# Patient Record
Sex: Female | Born: 1970 | Race: Black or African American | Hispanic: No | Marital: Single | Smoking: Never smoker
Health system: Southern US, Community
[De-identification: ages and names within clinical notes are randomized; demographics above are authoritative.]

## PROBLEM LIST (undated history)

## (undated) DIAGNOSIS — Z789 Other specified health status: Secondary | ICD-10-CM

## (undated) HISTORY — PX: TUBAL LIGATION: SHX77

## (undated) HISTORY — PX: OTHER SURGICAL HISTORY: SHX169

## (undated) HISTORY — PX: DILATION AND CURETTAGE OF UTERUS: SHX78

---

## 1998-01-08 ENCOUNTER — Other Ambulatory Visit: Admission: RE | Admit: 1998-01-08 | Discharge: 1998-01-08 | Payer: Self-pay | Admitting: Obstetrics

## 1999-01-20 ENCOUNTER — Other Ambulatory Visit: Admission: RE | Admit: 1999-01-20 | Discharge: 1999-01-20 | Payer: Self-pay | Admitting: Obstetrics

## 1999-03-13 ENCOUNTER — Ambulatory Visit (HOSPITAL_BASED_OUTPATIENT_CLINIC_OR_DEPARTMENT_OTHER): Admission: RE | Admit: 1999-03-13 | Discharge: 1999-03-13 | Payer: Self-pay | Admitting: Ophthalmology

## 1999-07-23 ENCOUNTER — Other Ambulatory Visit: Admission: RE | Admit: 1999-07-23 | Discharge: 1999-07-23 | Payer: Self-pay | Admitting: Obstetrics

## 2000-05-06 ENCOUNTER — Emergency Department (HOSPITAL_COMMUNITY): Admission: EM | Admit: 2000-05-06 | Discharge: 2000-05-06 | Payer: Self-pay | Admitting: Emergency Medicine

## 2000-05-10 ENCOUNTER — Emergency Department (HOSPITAL_COMMUNITY): Admission: EM | Admit: 2000-05-10 | Discharge: 2000-05-10 | Payer: Self-pay | Admitting: Emergency Medicine

## 2000-05-10 ENCOUNTER — Encounter: Payer: Self-pay | Admitting: Emergency Medicine

## 2001-07-09 ENCOUNTER — Emergency Department (HOSPITAL_COMMUNITY): Admission: EM | Admit: 2001-07-09 | Discharge: 2001-07-09 | Payer: Self-pay | Admitting: Emergency Medicine

## 2001-07-14 ENCOUNTER — Emergency Department (HOSPITAL_COMMUNITY): Admission: EM | Admit: 2001-07-14 | Discharge: 2001-07-15 | Payer: Self-pay | Admitting: Emergency Medicine

## 2001-07-15 ENCOUNTER — Encounter: Payer: Self-pay | Admitting: Emergency Medicine

## 2001-07-25 ENCOUNTER — Other Ambulatory Visit: Admission: RE | Admit: 2001-07-25 | Discharge: 2001-07-25 | Payer: Self-pay | Admitting: Family Medicine

## 2002-10-25 ENCOUNTER — Emergency Department (HOSPITAL_COMMUNITY): Admission: EM | Admit: 2002-10-25 | Discharge: 2002-10-25 | Payer: Self-pay | Admitting: Emergency Medicine

## 2002-11-23 ENCOUNTER — Emergency Department (HOSPITAL_COMMUNITY): Admission: EM | Admit: 2002-11-23 | Discharge: 2002-11-23 | Payer: Self-pay | Admitting: Emergency Medicine

## 2003-01-09 ENCOUNTER — Other Ambulatory Visit: Admission: RE | Admit: 2003-01-09 | Discharge: 2003-01-09 | Payer: Self-pay | Admitting: Family Medicine

## 2003-01-14 ENCOUNTER — Ambulatory Visit (HOSPITAL_COMMUNITY): Admission: RE | Admit: 2003-01-14 | Discharge: 2003-01-14 | Payer: Self-pay | Admitting: Family Medicine

## 2003-08-19 ENCOUNTER — Ambulatory Visit (HOSPITAL_BASED_OUTPATIENT_CLINIC_OR_DEPARTMENT_OTHER): Admission: RE | Admit: 2003-08-19 | Discharge: 2003-08-19 | Payer: Self-pay | Admitting: Family Medicine

## 2004-02-27 ENCOUNTER — Other Ambulatory Visit: Admission: RE | Admit: 2004-02-27 | Discharge: 2004-02-27 | Payer: Self-pay | Admitting: Family Medicine

## 2005-02-23 ENCOUNTER — Emergency Department: Payer: Self-pay | Admitting: Emergency Medicine

## 2005-02-25 ENCOUNTER — Encounter: Admission: RE | Admit: 2005-02-25 | Discharge: 2005-02-25 | Payer: Self-pay | Admitting: Family Medicine

## 2005-02-25 IMAGING — US US TRANSVAGINAL NON-OB
1 series · 14 of 25 positions shown · non-contrast
Comparison: none

CLINICAL DATA: Vaginal bleeding.  
PELVIC ULTRASOUND COMPLETE W/TRANSVAGINAL:
TECHNIQUE: Both transabdominal and transvaginal ultrasound examinations of the pelvis were performed including evaluation of the uterus, ovaries, adnexal regions, and pelvic cul-de-sac.
The uterus measures 8.4cm in length and the fundus measures 4.9 x 5.9cm in transverse dimensions.  Incidental cervical nabothian cyst.  Endometrial complex measures 9mm in thickness.  Right ovary measures 2.7 x 2.9 x 3.0cm and the left ovary measures 1.7 x 2.5 x 2.6cm.  imple cyst in the right ovary which measures 2.1 x 2.4 x 2.6cm and a simple cyst in the left ovary measuring 0.9 x 1.2 x 1.3cm.  Minimal free pelvic fluid.

[Series 1: unknown · 0.25mm/px · 14 of 53 slices shown]
[im 1/53]
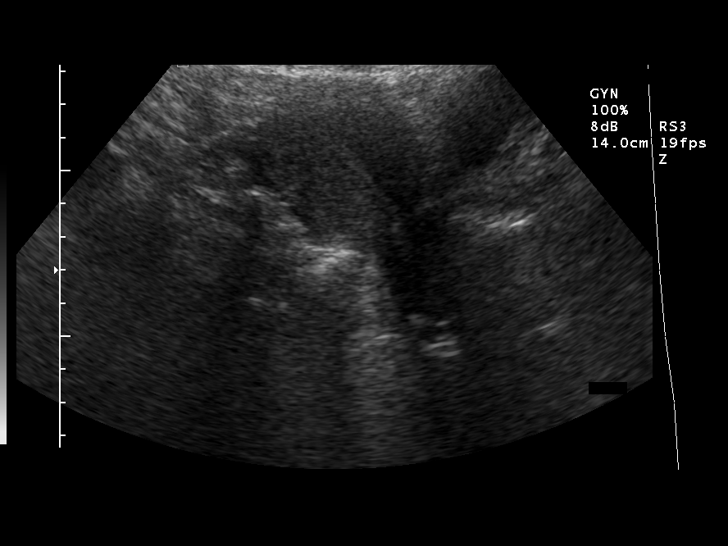
[im 5/53]
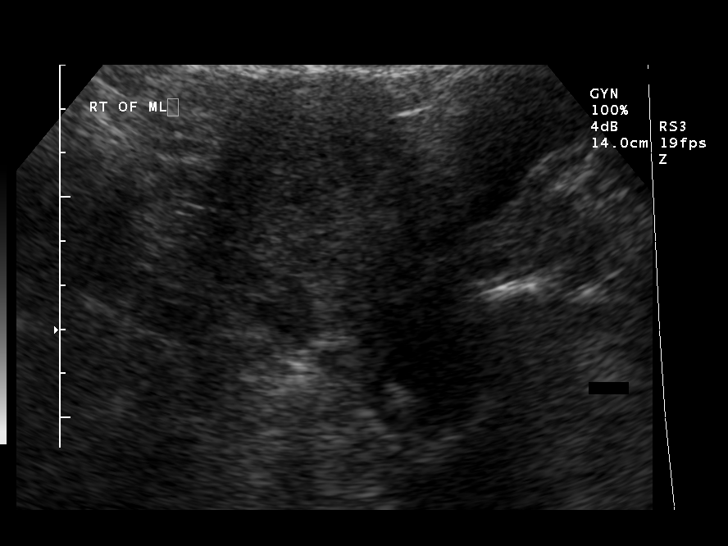
[im 9/53]
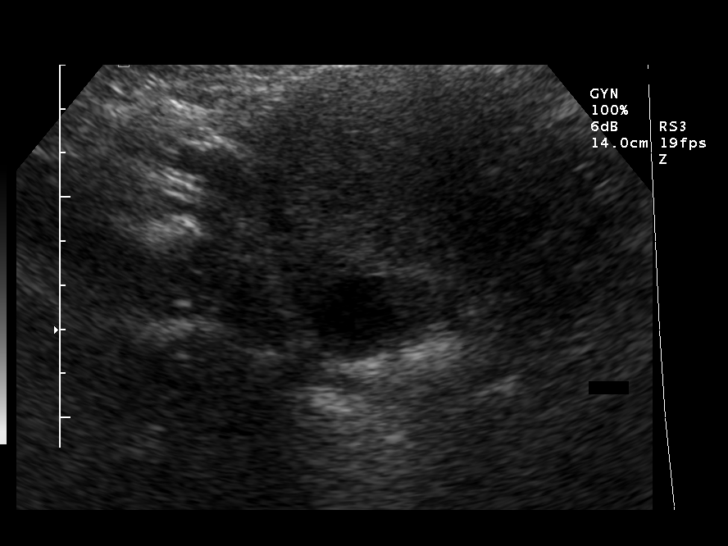
[im 14/53]
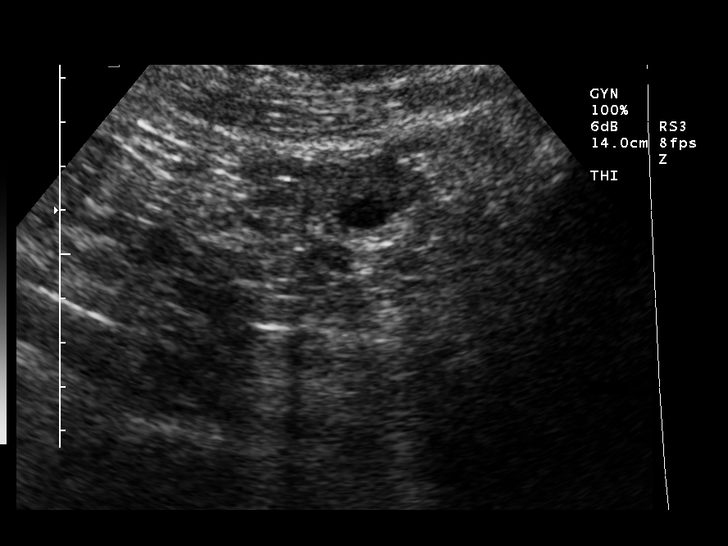
[im 18/53]
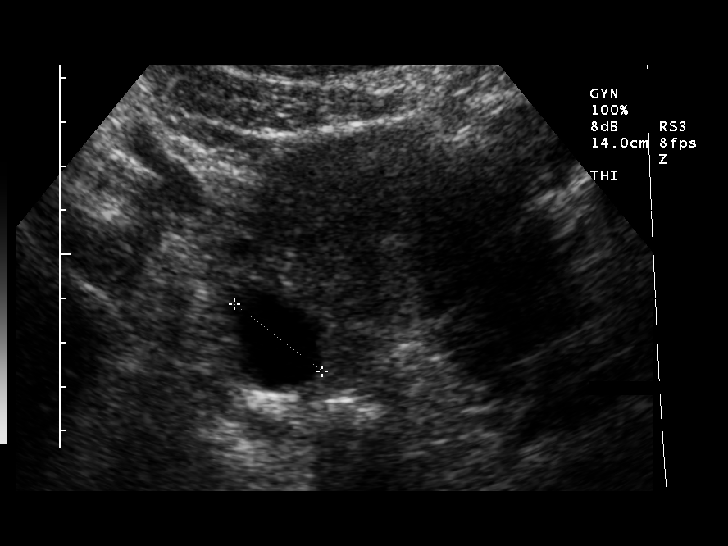
[im 20/53]
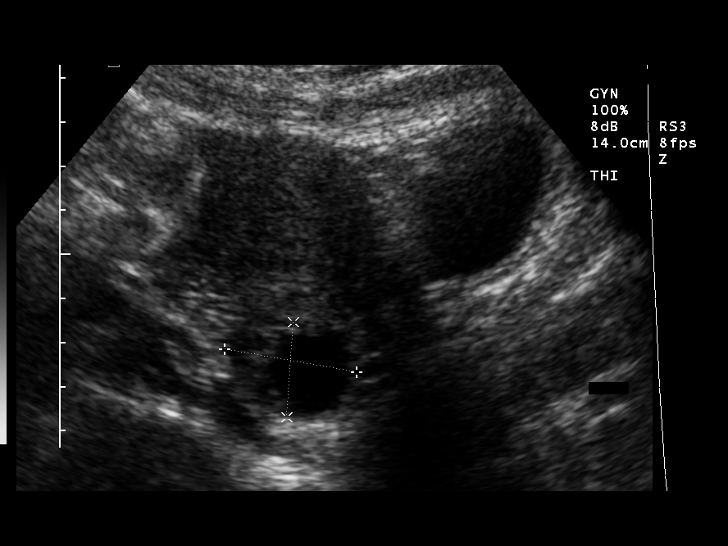
[im 24/53]
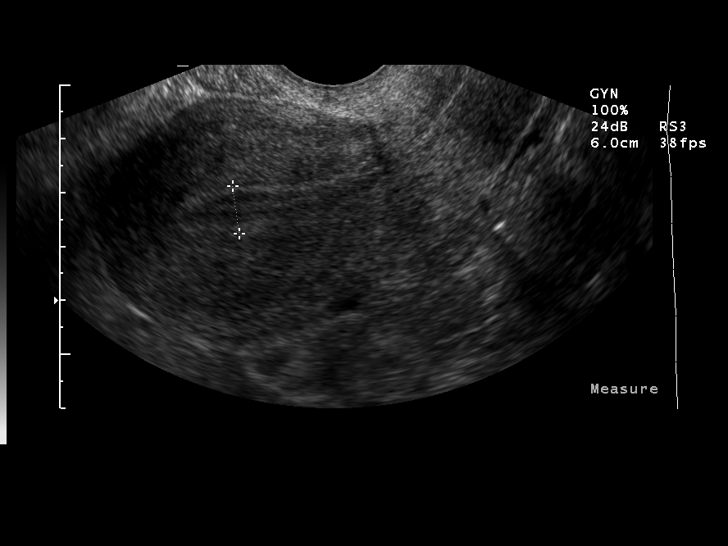
[im 29/53]
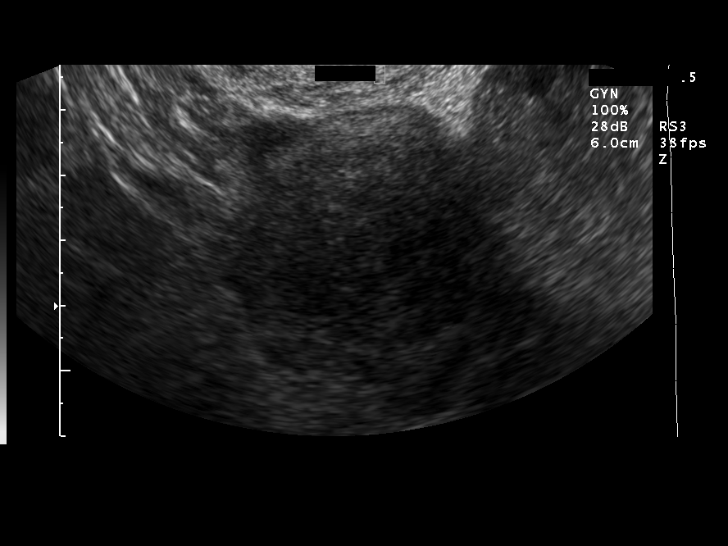
[im 33/53]
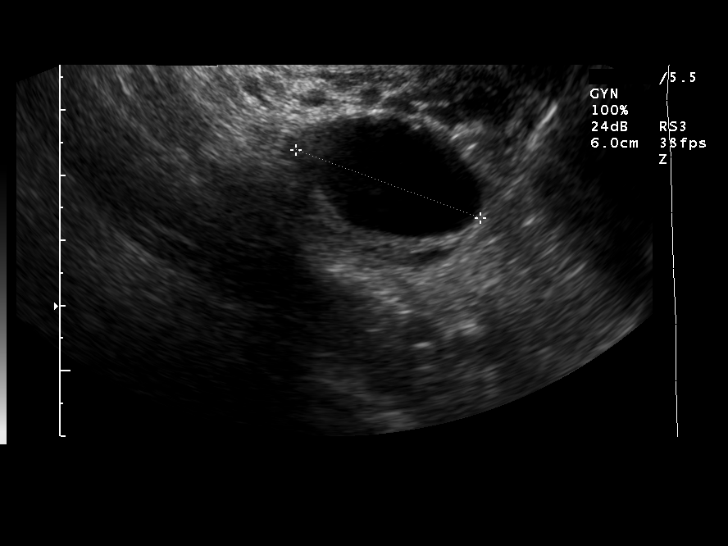
[im 35/53]
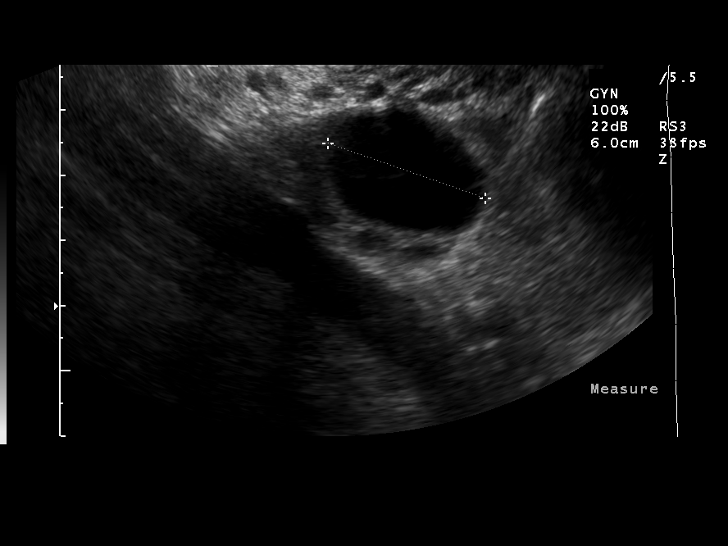
[im 40/53]
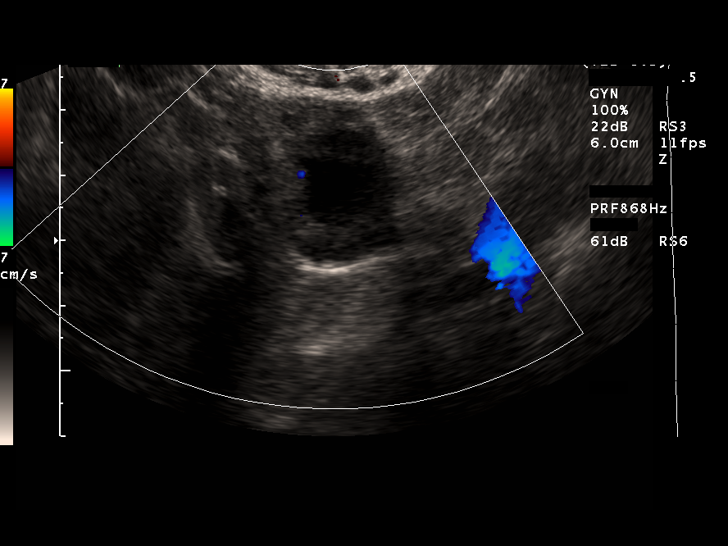
[im 44/53]
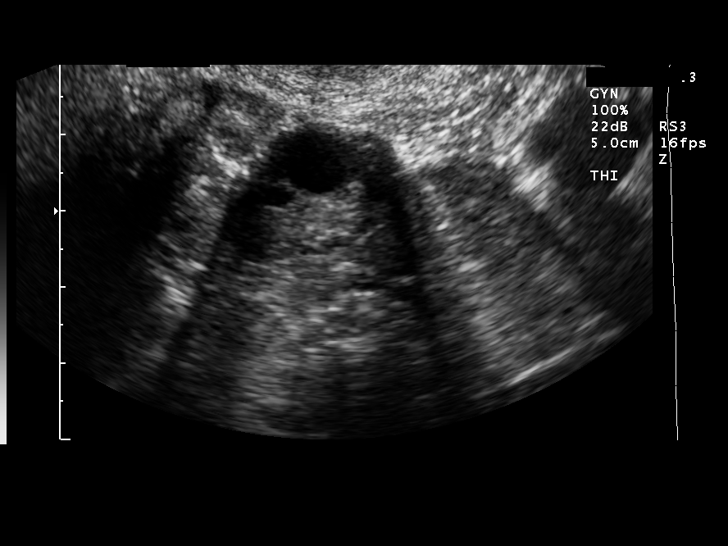
[im 48/53]
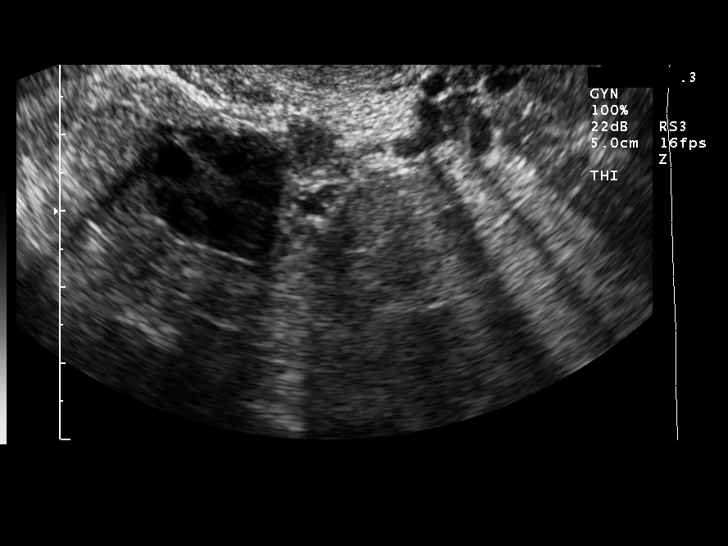
[im 53/53]
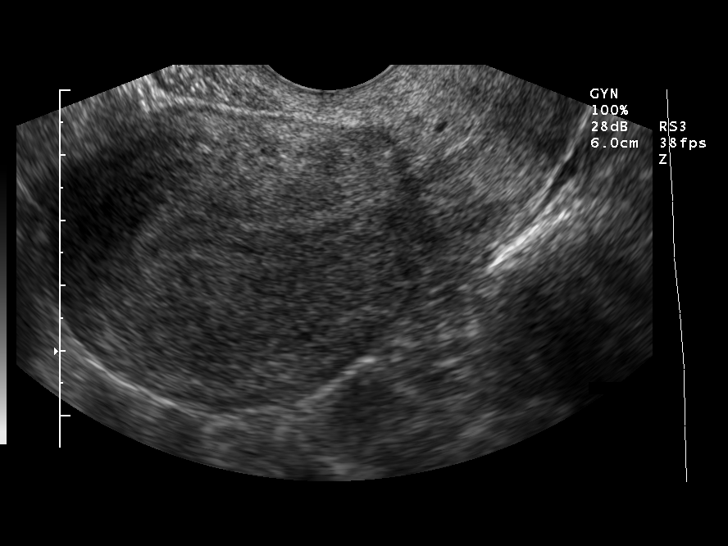

[14 of 25 positions shown; findings below may reference images not displayed]

IMPRESSION: Normal uterus.  Bilateral ovarian cysts.  See comments above.

## 2005-03-23 ENCOUNTER — Other Ambulatory Visit: Admission: RE | Admit: 2005-03-23 | Discharge: 2005-03-23 | Payer: Self-pay | Admitting: Family Medicine

## 2006-03-29 ENCOUNTER — Other Ambulatory Visit: Admission: RE | Admit: 2006-03-29 | Discharge: 2006-03-29 | Payer: Self-pay | Admitting: Family Medicine

## 2006-09-05 ENCOUNTER — Encounter: Admission: RE | Admit: 2006-09-05 | Discharge: 2006-09-05 | Payer: Self-pay | Admitting: Family Medicine

## 2006-09-05 IMAGING — CR DG OS CALCIS 2+V*R*
2 series · 2 of 2 positions shown · non-contrast
Comparison: none

CLINICAL DATA: Right heel pain, no injury.
 RIGHT OS CALCIS:
 Two views of the right os calcis were obtained.  No degenerative calcaneal spur is seen.  The ankle joint appears normal and the subtalar joint is grossly normal.

[view not recorded (1 of 2)]
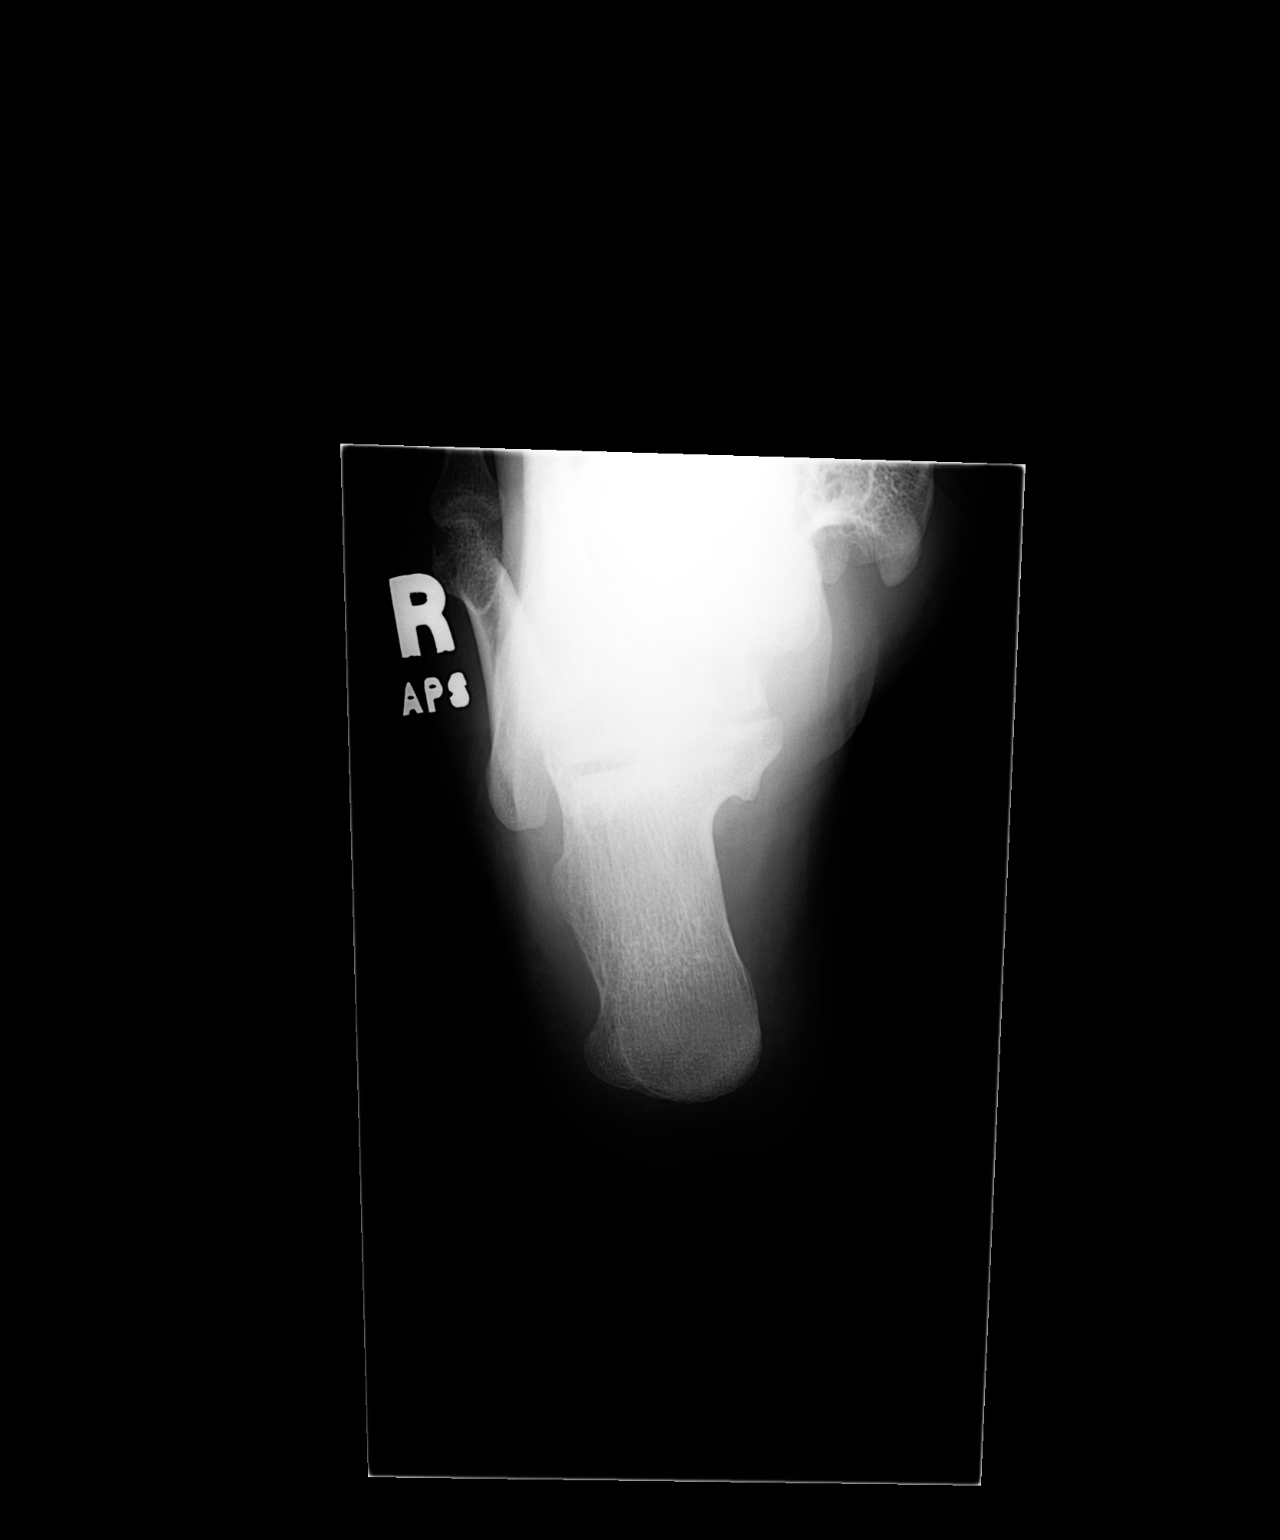

[view not recorded (2 of 2)]
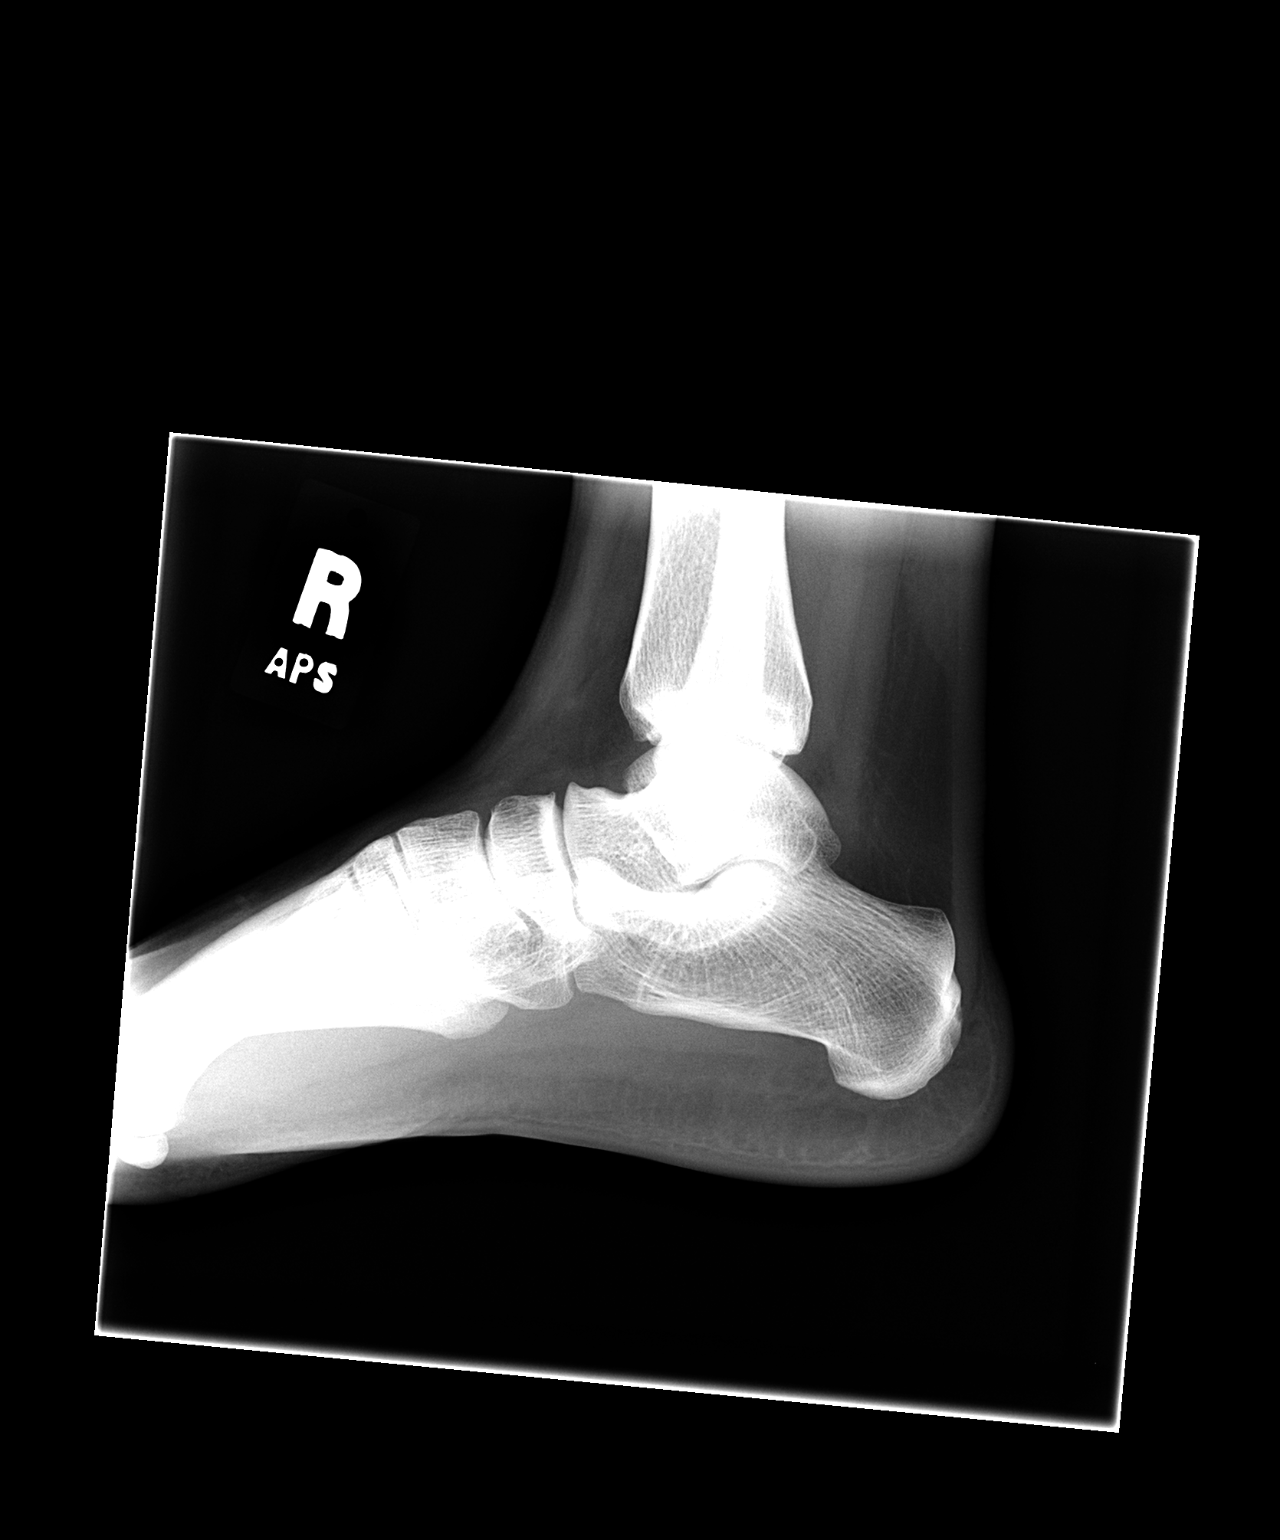

[2 of 2 positions shown; findings below may reference images not displayed]

IMPRESSION: Negative.
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
## 2009-09-20 ENCOUNTER — Emergency Department (HOSPITAL_COMMUNITY): Admission: EM | Admit: 2009-09-20 | Discharge: 2009-09-20 | Payer: Self-pay | Admitting: Emergency Medicine
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
## 2009-09-23 ENCOUNTER — Emergency Department (HOSPITAL_COMMUNITY): Admission: EM | Admit: 2009-09-23 | Discharge: 2009-09-23 | Payer: Self-pay | Admitting: Emergency Medicine

## 2010-01-03 ENCOUNTER — Emergency Department (HOSPITAL_COMMUNITY): Admission: EM | Admit: 2010-01-03 | Discharge: 2010-01-03 | Payer: Self-pay | Admitting: Emergency Medicine
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
## 2010-05-20 LAB — D-DIMER, QUANTITATIVE: D-Dimer, Quant: 0.33 ug/mL-FEU (ref 0.00–0.48)

## 2012-03-31 ENCOUNTER — Ambulatory Visit: Payer: Self-pay | Admitting: Internal Medicine

## 2012-03-31 VITALS — BP 128/88 | HR 86 | Temp 98.5°F | Resp 16 | Ht 63.0 in | Wt 236.0 lb

## 2012-03-31 DIAGNOSIS — Z029 Encounter for administrative examinations, unspecified: Secondary | ICD-10-CM

## 2012-03-31 DIAGNOSIS — Z0289 Encounter for other administrative examinations: Secondary | ICD-10-CM

## 2012-04-02 NOTE — Progress Notes (Signed)
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

## 2012-04-06 ENCOUNTER — Encounter: Payer: Self-pay | Admitting: Emergency Medicine

## 2012-04-12 ENCOUNTER — Inpatient Hospital Stay (HOSPITAL_COMMUNITY)
Admission: AD | Admit: 2012-04-12 | Discharge: 2012-04-12 | Disposition: A | Discharge status: Home / Self Care | Payer: Self-pay | Source: Ambulatory Visit | Attending: Obstetrics and Gynecology | Admitting: Obstetrics and Gynecology

## 2012-04-12 ENCOUNTER — Encounter (HOSPITAL_COMMUNITY): Payer: Self-pay | Admitting: *Deleted

## 2012-04-12 DIAGNOSIS — B9689 Other specified bacterial agents as the cause of diseases classified elsewhere: Secondary | ICD-10-CM | POA: Insufficient documentation

## 2012-04-12 DIAGNOSIS — N760 Acute vaginitis: Secondary | ICD-10-CM | POA: Insufficient documentation

## 2012-04-12 DIAGNOSIS — N949 Unspecified condition associated with female genital organs and menstrual cycle: Secondary | ICD-10-CM | POA: Insufficient documentation

## 2012-04-12 DIAGNOSIS — A499 Bacterial infection, unspecified: Secondary | ICD-10-CM

## 2012-04-12 HISTORY — DX: Other specified health status: Z78.9

## 2012-04-12 LAB — URINALYSIS, ROUTINE W REFLEX MICROSCOPIC
Bilirubin Urine: NEGATIVE
Protein, ur: NEGATIVE mg/dL
Specific Gravity, Urine: 1.02 (ref 1.005–1.030)
Urobilinogen, UA: 0.2 mg/dL (ref 0.0–1.0)
pH: 6 (ref 5.0–8.0)

## 2012-04-12 LAB — WET PREP, GENITAL

## 2012-04-12 LAB — URINE MICROSCOPIC-ADD ON

## 2012-04-12 MED ORDER — METRONIDAZOLE 500 MG PO TABS: 500.0000 mg | ORAL_TABLET | Freq: Two times a day (BID) | ORAL | Status: DC

## 2012-04-12 NOTE — MAU Note (Signed)
Hx of tubal, cycles very light.  Noted a spot on labia- like irritated.  No actual discharge- just the odor.

## 2012-04-12 NOTE — MAU Note (Signed)
Beginning of Jan, partner noted a smell. Since then she has smelled it.  Used OTC norforms- thinks it made it worse, use OTC yeast treatment- didn't help.     Had a urine test- was told had UTI- was not treated.  The odor has gotten worse.

## 2012-04-12 NOTE — MAU Provider Note (Signed)
History     CSN: [id number]  Arrival date and time: 04/12/12 1831   First Provider Initiated Contact with Patient 04/12/12 2013      Chief Complaint  Patient presents with  . Vaginal Discharge   HPI [name] is a 42 y.o. [id number] who presents to MAU complaining of vaginal odor x 3 weeks. The patient denies abnormal discharge. She started her LMP today. She feels that the odor is worse today. She denies UTI symptoms or pelvic pain, N/V or fever.   OB History    Grav Para Term Preterm Abortions TAB SAB Ect Mult Living   5 1 1  0 4 3 1  0 0 1      Past Medical History  Diagnosis Date  . No pertinent past medical history     Past Surgical History  Procedure Date  . Cesarean section   . Uterine oblation   . Tubal ligation     Family History  Problem Relation Age of Onset  . Diabetes Mother   . Diabetes Father   . Heart disease Sister   . Diabetes Brother   . Diabetes Brother     History  Substance Use Topics  . Smoking status: Never Smoker   . Smokeless tobacco: Not on file  . Alcohol Use: Not on file    Allergies: No Known Allergies  No prescriptions prior to admission    Review of Systems  Constitutional: Negative for fever.  Gastrointestinal: Negative for nausea, vomiting and abdominal pain.  Genitourinary: Negative for dysuria, urgency and frequency.   Physical Exam   Blood pressure 136/85, pulse 78, temperature 98.1 F (36.7 C), temperature source Oral, resp. rate 20, height 5\' 4"  (1.626 m), weight 239 lb (108.41 kg), last menstrual period 04/12/2012.  Physical Exam  Constitutional: She is oriented to person, place, and time. She appears well-developed and well-nourished. No distress.  HENT:  Head: Normocephalic.  Cardiovascular: Normal rate.   Respiratory: Effort normal.  GI: Soft. She exhibits no distension and no mass. There is no tenderness. There is no rebound and no guarding.  Genitourinary: Vagina normal. Uterus is not enlarged  and not tender. Cervix exhibits discharge (small amount of dark blood noted at the cervical os and in the vagina). Cervix exhibits no motion tenderness and no friability.  Neurological: She is alert and oriented to person, place, and time.  Skin: Skin is warm and dry. No erythema.   Results for orders placed during the hospital encounter of 04/12/12 (from the past 24 hour(s))  URINALYSIS, ROUTINE W REFLEX MICROSCOPIC     Status: Abnormal   Collection Time   04/12/12  7:00 PM      Component Value Range   Color, Urine YELLOW  YELLOW   APPearance CLEAR  CLEAR   Specific Gravity, Urine 1.020  1.005 - 1.030   pH 6.0  5.0 - 8.0   Glucose, UA NEGATIVE  NEGATIVE mg/dL   Hgb urine dipstick LARGE (*) NEGATIVE   Bilirubin Urine NEGATIVE  NEGATIVE   Ketones, ur NEGATIVE  NEGATIVE mg/dL   Protein, ur NEGATIVE  NEGATIVE mg/dL   Urobilinogen, UA 0.2  0.0 - 1.0 mg/dL   Nitrite NEGATIVE  NEGATIVE   Leukocytes, UA SMALL (*) NEGATIVE  URINE MICROSCOPIC-ADD ON     Status: Abnormal   Collection Time   04/12/12  7:00 PM      Component Value Range   Squamous Epithelial / LPF FEW (*) RARE   WBC, UA 3-6  <  3 WBC/hpf   RBC / HPF 7-10  <3 RBC/hpf   Bacteria, UA FEW (*) RARE  POCT PREGNANCY, URINE     Status: Normal   Collection Time   04/12/12  7:08 PM      Component Value Range   Preg Test, Ur NEGATIVE  NEGATIVE    MAU Course  Procedures None   Assessment and Plan  A: Bacterial vaginosis  P: Discharge home Rx for Flagyl sent to patient's pharmacy Discussed hygiene products and probiotics for avoiding recurrent BV Patient may return to MAU as needed or if her condition should change or worsen  Freddi Starr, PA-C 04/12/2012, 8:32 PM

## 2012-04-12 NOTE — Progress Notes (Signed)
Spotting  menes

## 2012-04-12 NOTE — MAU Note (Signed)
Pt states she states she notices vaginal discharge and odor the 2nd wk in January.

## 2012-04-12 NOTE — Progress Notes (Signed)
Pt denies discharge pt states she has an odor

## 2012-04-14 LAB — URINE CULTURE

## 2012-04-29 NOTE — MAU Provider Note (Signed)
Attestation of Attending Supervision of Advanced Practitioner: Evaluation and management procedures were performed by the PA/NP/CNM/OB Fellow under my supervision/collaboration. Chart reviewed and agree with management and plan.  Banner Huckaba V 04/29/2012 6:11 AM   

## 2012-08-24 ENCOUNTER — Ambulatory Visit: Payer: Self-pay | Admitting: Family Medicine

## 2012-08-24 VITALS — BP 128/72 | HR 90 | Temp 97.8°F | Resp 18 | Ht 63.5 in | Wt 233.8 lb

## 2012-08-24 DIAGNOSIS — D649 Anemia, unspecified: Secondary | ICD-10-CM

## 2012-08-24 DIAGNOSIS — Z0000 Encounter for general adult medical examination without abnormal findings: Secondary | ICD-10-CM

## 2012-08-24 DIAGNOSIS — N939 Abnormal uterine and vaginal bleeding, unspecified: Secondary | ICD-10-CM

## 2012-08-24 LAB — POCT URINALYSIS DIPSTICK
Bilirubin, UA: NEGATIVE
Glucose, UA: NEGATIVE
Leukocytes, UA: NEGATIVE
Nitrite, UA: NEGATIVE
Spec Grav, UA: >=1.03
Urobilinogen, UA: 1
pH, UA: 6

## 2012-08-24 LAB — POCT WET PREP WITH KOH
KOH Prep POC: NEGATIVE
Trichomonas, UA: NEGATIVE
Yeast Wet Prep HPF POC: NEGATIVE

## 2012-08-24 LAB — POCT CBC
Granulocyte percent: 60.1 %G (ref 37–80)
HCT, POC: 38.2 % (ref 37.7–47.9)
Hemoglobin: 11.6 g/dL — AB (ref 12.2–16.2)
Lymph, poc: 1.8 (ref 0.6–3.4)
MCH, POC: 30 pg (ref 27–31.2)
MCHC: 30.4 g/dL — AB (ref 31.8–35.4)
MCV: 98.6 fL — AB (ref 80–97)
MID (cbc): 0.4 (ref 0–0.9)
MPV: 9.3 fL (ref 0–99.8)
POC Granulocyte: 3.3 (ref 2–6.9)
POC LYMPH PERCENT: 32.1 %L (ref 10–50)
POC MID %: 7.8 %M (ref 0–12)
Platelet Count, POC: 245 10*3/uL (ref 142–424)
RBC: 3.87 M/uL — AB (ref 4.04–5.48)
RDW, POC: 13.5 %
WBC: 5.5 10*3/uL (ref 4.6–10.2)

## 2012-08-24 NOTE — Patient Instructions (Addendum)
Bacterial Vaginosis Bacterial vaginosis (BV) is a vaginal infection where the normal balance of bacteria in the vagina is disrupted. The normal balance is then replaced by an overgrowth of certain bacteria. There are several different kinds of bacteria that can cause BV. BV is the most common vaginal infection in women of childbearing age. CAUSES   The cause of BV is not fully understood. BV develops when there is an increase or imbalance of harmful bacteria.  Some activities or behaviors can upset the normal balance of bacteria in the vagina and put women at increased risk including:  Having a new sex partner or multiple sex partners.  Douching.  Using an intrauterine device (IUD) for contraception.  It is not clear what role sexual activity plays in the development of BV. However, women that have never had sexual intercourse are rarely infected with BV. Women do not get BV from toilet seats, bedding, swimming pools or from touching objects around them.  SYMPTOMS   Grey vaginal discharge.  A fish-like odor with discharge, especially after sexual intercourse.  Itching or burning of the vagina and vulva.  Burning or pain with urination.  Some women have no signs or symptoms at all. DIAGNOSIS  Your caregiver must examine the vagina for signs of BV. Your caregiver will perform lab tests and look at the sample of vaginal fluid through a microscope. They will look for bacteria and abnormal cells (clue cells), a pH test higher than 4.5, and a positive amine test all associated with BV.  RISKS AND COMPLICATIONS   Pelvic inflammatory disease (PID).  Infections following gynecology surgery.  Developing HIV.  Developing herpes virus. TREATMENT  Sometimes BV will clear up without treatment. However, all women with symptoms of BV should be treated to avoid complications, especially if gynecology surgery is planned. Female partners generally do not need to be treated. However, BV may spread  between female sex partners so treatment is helpful in preventing a recurrence of BV.   BV may be treated with antibiotics. The antibiotics come in either pill or vaginal cream forms. Either can be used with nonpregnant or pregnant women, but the recommended dosages differ. These antibiotics are not harmful to the baby.  BV can recur after treatment. If this happens, a second round of antibiotics will often be prescribed.  Treatment is important for pregnant women. If not treated, BV can cause a premature delivery, especially for a pregnant woman who had a premature birth in the past. All pregnant women who have symptoms of BV should be checked and treated.  For chronic reoccurrence of BV, treatment with a type of prescribed gel vaginally twice a week is helpful. HOME CARE INSTRUCTIONS   Finish all medication as directed by your caregiver.  Do not have sex until treatment is completed.  Tell your sexual partner that you have a vaginal infection. They should see their caregiver and be treated if they have problems, such as a mild rash or itching.  Practice safe sex. Use condoms. Only have 1 sex partner. PREVENTION  Basic prevention steps can help reduce the risk of upsetting the natural balance of bacteria in the vagina and developing BV:  Do not have sexual intercourse (be abstinent).  Do not douche.  Use all of the medicine prescribed for treatment of BV, even if the signs and symptoms go away.  Tell your sex partner if you have BV. That way, they can be treated, if needed, to prevent reoccurrence. SEEK MEDICAL CARE IF:     Your symptoms are not improving after 3 days of treatment.  You have increased discharge, pain, or fever. MAKE SURE YOU:   Understand these instructions.  Will watch your condition.  Will get help right away if you are not doing well or get worse. FOR MORE INFORMATION  Division of STD Prevention (DSTDP), Centers for Disease Control and Prevention:  [employer] American Social Health Association (ASHA): www.ashastd.org  Document Released: 02/22/2005 Document Revised: 05/17/2011 Document Reviewed: 08/15/2008 [facility] Patient Information 2014 [place], [place]. Iron Deficiency Anemia There are many types of anemia. Iron deficiency anemia is the most common. Iron deficiency anemia is a decrease in the number of red blood cells caused by too little iron. Without enough iron, your body does not produce enough hemoglobin. Hemoglobin is a substance in red blood cells that carries oxygen to the body's tissues. Iron deficiency anemia may leave you tired and short of breath. CAUSES   Lack of iron in the diet.  This may be seen in infants and children, because there is little iron in milk.  This may be seen in adults who do not eat enough iron-rich foods.  This may be seen in pregnant or breastfeeding women who do not take iron supplements. There is a much higher need for iron intake at these times.  Poor absorption of iron, as seen with intestinal disorders.  Intestinal bleeding.  Heavy periods. SYMPTOMS  Mild anemia may not be noticeable. Symptoms may include:  Fatigue.  Headache.  Pale skin.  Weakness.  Shortness of breath.  Dizziness.  Cold hands and feet.  Fast or irregular heartbeat. DIAGNOSIS  Diagnosis requires a thorough evaluation and physical exam by your caregiver.  Blood tests are generally used to confirm iron deficiency anemia.  Additional tests may be done to find the underlying cause of your anemia. These may include:  Testing for blood in the stool (fecal occult blood test).  A procedure to see inside the colon and rectum (colonoscopy).  A procedure to see inside the esophagus and stomach (endoscopy). TREATMENT   Correcting the cause of the iron deficiency is the first step.  Medicines, such as oral contraceptives, can make heavy menstrual flows lighter.  Antibiotics and other medicines can be  used to treat peptic ulcers.  Surgery may be needed to remove a bleeding polyp, tumor, or fibroid.  Often, iron supplements (ferrous sulfate) are taken.  For the best iron absorption, take these supplements with an empty stomach.  You may need to take the supplements with food if you cannot tolerate them on an empty stomach. Vitamin C improves the absorption of iron. Your caregiver may recommend taking your iron tablets with a glass of orange juice or vitamin C supplement.  Milk and antacids should not be taken at the same time as iron supplements. They may interfere with the absorption of iron.  Iron supplements can cause constipation. A stool softener is often recommended.  Pregnant and breastfeeding women will need to take extra iron, because their normal diet usually will not provide the required amount.  Patients who cannot tolerate iron by mouth can take it through a vein (intravenously) or by an injection into the muscle. HOME CARE INSTRUCTIONS   Ask your dietitian for help with diet questions.  Take iron and vitamins as directed by your caregiver.  Eat a diet rich in iron. Eat liver, lean beef, whole-grain bread, eggs, dried fruit, and dark green leafy vegetables. SEEK IMMEDIATE MEDICAL CARE IF:   You have a fainting episode.  Do not drive yourself. Call your local emergency services (911 in [place] if no other help is available.  You have chest pain, nausea, or vomiting.  You develop severe or increased shortness of breath with activities.  You develop weakness or increased thirst.  You have a rapid heartbeat.  You develop unexplained sweating or become lightheaded when getting up from a chair or bed. MAKE SURE YOU:   Understand these instructions.  Will watch your condition.  Will get help right away if you are not doing well or get worse. Document Released: 02/20/2000 Document Revised: 05/17/2011 Document Reviewed: 07/01/2009 [facility] Patient Information 2014  [place], [place].

## 2012-08-24 NOTE — Progress Notes (Signed)
Patient ID: Kaylee Nixon MRN: [id number], DOB: December 11, 1970, 42 y.o. Date of Encounter: 08/24/2012, 6:34 PM  Primary Physician: No primary provider on file.  Chief Complaint: Physical (CPE)  HPI: 42 y.o. y/o female with history of noted below here for CPE.  Doing well. No issues/complaints.  LMP: Friday the 13th Pap: 2009, S/P ablation MMG:  never Review of Systems: Consitutional: No fever, chills, fatigue, night sweats, lymphadenopathy, or weight changes. Eyes: No visual changes, eye redness, or discharge. ENT/Mouth: Ears: No otalgia, tinnitus, hearing loss, discharge. Nose: No congestion, rhinorrhea, sinus pain, or epistaxis. Throat: No sore throat, post nasal drip, or teeth pain. Cardiovascular: No CP, palpitations, diaphoresis, DOE, edema, orthopnea, PND. Respiratory: No cough, hemoptysis, SOB, or wheezing. Gastrointestinal: No anorexia, dysphagia, reflux, pain, nausea, vomiting, hematemesis, diarrhea, constipation, BRBPR, or melena. Breast: No discharge, pain, swelling, or mass. Genitourinary: No dysuria, frequency, urgency, hematuria, incontinence, nocturia, amenorrhea,  pruritis, burning, abnormal bleeding, or pain. Musculoskeletal: No decreased ROM, myalgias, stiffness, joint swelling, or weakness. Skin: No rash, erythema, lesion changes, pain, warmth, jaundice, or pruritis. Neurological: No headache, dizziness, syncope, seizures, tremors, memory loss, coordination problems, or paresthesias. Psychological: No anxiety, depression, hallucinations, SI/HI. Endocrine: No fatigue, polydipsia, polyphagia, polyuria, or known diabetes. All other systems were reviewed and are otherwise negative.  Past Medical History  Diagnosis Date  . No pertinent past medical history      Past Surgical History  Procedure Laterality Date  . Cesarean section    . Uterine oblation    . Tubal ligation      Home Meds:  Prior to Admission medications   Medication Sig Start Date End Date  Taking? Authorizing Provider  metroNIDAZOLE (FLAGYL) 500 MG tablet Take 1 tablet (500 mg total) by mouth 2 (two) times daily. 04/12/12   Freddi Starr, PA-C    Allergies: No Known Allergies  History   Social History  . Marital Status: Single    Spouse Name: N/A    Number of Children: N/A  . Years of Education: N/A   Occupational History  . Not on file.   Social History Main Topics  . Smoking status: Never Smoker   . Smokeless tobacco: Not on file  . Alcohol Use: Yes  . Drug Use: No  . Sexually Active: Not on file   Other Topics Concern  . Not on file   Social History Narrative  . No narrative on file    Family History  Problem Relation Age of Onset  . Diabetes Mother   . Diabetes Father   . Heart disease Sister   . Diabetes Brother   . Diabetes Brother     Physical Exam: Blood pressure 128/72, pulse 90, temperature 97.8 F (36.6 C), temperature source Oral, resp. rate 18, height 5' 3.5" (1.613 m), weight 233 lb 12.8 oz (106.051 kg), SpO2 100.00%., Body mass index is 40.76 kg/(m^2). General: Well developed, well nourished, in no acute distress. HEENT: Normocephalic, atraumatic. Conjunctiva pink, sclera non-icteric. Pupils 2 mm constricting to 1 mm, round, regular, and equally reactive to light and accomodation. EOMI. Internal auditory canal clear. TMs with good cone of light and without pathology. Nasal mucosa pink. Nares are without discharge. No sinus tenderness. Oral mucosa pink. Dentition normal. Pharynx without exudate.   Neck: Supple. Trachea midline. No thyromegaly. Full ROM. No lymphadenopathy. Lungs: Clear to auscultation bilaterally without wheezes, rales, or rhonchi. Breathing is of normal effort and unlabored. Cardiovascular: RRR with S1 S2. No murmurs, rubs, or gallops appreciated. Distal pulses 2+  symmetrically. No carotid or abdominal bruits. Breast: Symmetrical. No masses. Nipples without discharge. Abdomen: Soft, non-tender, non-distended with  normoactive bowel sounds. No hepatosplenomegaly or masses. No rebound/guarding. No CVA tenderness. Without hernias.  Genitourinary:  External genitalia without lesions. Vaginal mucosa pink. Cervix pink and without discharge. No cervical or adnexal tenderness. Pap smear taken Musculoskeletal: Full range of motion and 5/5 strength throughout. Without swelling, atrophy, tenderness, crepitus, or warmth. Extremities without clubbing, cyanosis, or edema. Calves supple. Skin: Warm and moist without erythema, ecchymosis, wounds, or rash. Neuro: A+Ox3. CN II-XII grossly intact. Moves all extremities spontaneously. Full sensation throughout. Normal gait. DTR 2+ throughout upper and lower extremities. Finger to nose intact. Psych:  Responds to questions appropriately with a normal affect.   Results for orders placed in visit on 08/24/12  POCT WET PREP WITH KOH      Result Value Range   Trichomonas, UA Negative     Clue Cells Wet Prep HPF POC 2-3     Epithelial Wet Prep HPF POC 0-2     Yeast Wet Prep HPF POC neg     Bacteria Wet Prep HPF POC 1+     RBC Wet Prep HPF POC 0-1     WBC Wet Prep HPF POC 2-3     KOH Prep POC Negative    POCT CBC      Result Value Range   WBC 5.5  4.6 - 10.2 K/uL   Lymph, poc 1.8  0.6 - 3.4   POC LYMPH PERCENT 32.1  10 - 50 %L   MID (cbc) 0.4  0 - 0.9   POC MID % 7.8  0 - 12 %M   POC Granulocyte 3.3  2 - 6.9   Granulocyte percent 60.1  37 - 80 %G   RBC 3.87 (*) 4.04 - 5.48 M/uL   Hemoglobin 11.6 (*) 12.2 - 16.2 g/dL   HCT, POC 16.1  09.6 - 47.9 %   MCV 98.6 (*) 80 - 97 fL   MCH, POC 30.0  27 - 31.2 pg   MCHC 30.4 (*) 31.8 - 35.4 g/dL   RDW, POC 04.5     Platelet Count, POC 245  142 - 424 K/uL   MPV 9.3  0 - 99.8 fL  POCT URINALYSIS DIPSTICK      Result Value Range   Color, UA yellow     Clarity, UA hazy     Glucose, UA neg     Bilirubin, UA neg     Ketones, UA trace     Spec Grav, UA >=1.030     Blood, UA small     pH, UA 6.0     Protein, UA trace      Urobilinogen, UA 1.0     Nitrite, UA neg     Leukocytes, UA Negative       Assessment/Plan:  42 y.o. y/o female here for CPE -Routine general medical examination at a health care facility - Plan: POCT Wet Prep with KOH, Pap IG w/ reflex to HPV when ASC-U, POCT CBC, POCT urinalysis dipstick  Vaginal spotting - Plan: POCT CBC, POCT urinalysis dipstick  Anemia take iron as directed for 6 weeks   Signed, Elvina Sidle, MD 08/24/2012 6:34 PM

## 2012-08-28 LAB — PAP IG W/ RFLX HPV ASCU

## 2012-12-12 ENCOUNTER — Inpatient Hospital Stay (HOSPITAL_COMMUNITY)
Admission: AD | Admit: 2012-12-12 | Discharge: 2012-12-12 | Disposition: A | Discharge status: Home / Self Care | Payer: Self-pay | Source: Ambulatory Visit | Attending: Obstetrics & Gynecology | Admitting: Obstetrics & Gynecology

## 2012-12-12 ENCOUNTER — Encounter (HOSPITAL_COMMUNITY): Payer: Self-pay | Admitting: *Deleted

## 2012-12-12 DIAGNOSIS — N898 Other specified noninflammatory disorders of vagina: Secondary | ICD-10-CM

## 2012-12-12 DIAGNOSIS — B9689 Other specified bacterial agents as the cause of diseases classified elsewhere: Secondary | ICD-10-CM

## 2012-12-12 DIAGNOSIS — N949 Unspecified condition associated with female genital organs and menstrual cycle: Secondary | ICD-10-CM | POA: Insufficient documentation

## 2012-12-12 HISTORY — DX: Other specified health status: Z78.9

## 2012-12-12 LAB — WET PREP, GENITAL

## 2012-12-12 MED ORDER — METRONIDAZOLE 500 MG PO TABS: 500.0000 mg | ORAL_TABLET | Freq: Two times a day (BID) | ORAL | Status: DC

## 2012-12-12 MED ORDER — METRONIDAZOLE 500 MG PO TABS
500.0000 mg | ORAL_TABLET | Freq: Two times a day (BID) | ORAL | Status: DC
  Administered 2012-12-12: 500 mg via ORAL
  Filled 2012-12-12: qty 1

## 2012-12-12 NOTE — MAU Provider Note (Signed)
CC: Vaginal Discharge    First Provider Initiated Contact with Patient 12/12/12 1736      HPI Kaylee Nixon is a 42 y.o. [id number] who presents with onset one week ago of malodoroooousvaginal discharge. Describes the discharge as white and moderate amount; nonpruritic. For the last 2 nights she OTC lactobacillus tabs pv. Diagnosed with BV here 2/14 (few clues on WP and malodorous d/c) but denies other previous episodes. LMP 11/24/2012. Last intercourse for months ago Denies abnormal bleeding since endometrial ablation procedure 5 years ago.Had BTL.  Past Medical History  Diagnosis Date  . No pertinent past medical history   . Medical history non-contributory     OB History  Gravida Para Term Preterm AB SAB TAB Ectopic Multiple Living  5 1 1  0 4 1 3  0 0 1    # Outcome Date GA Lbr Len/2nd Weight Sex Delivery Anes PTL Lv  5 TAB           4 TAB           3 TAB           2 SAB           1 TRM               Past Surgical History  Procedure Laterality Date  . Cesarean section    . Uterine oblation    . Tubal ligation    . Dilation and curettage of uterus      History   Social History  . Marital Status: Single    Spouse Name: N/A    Number of Children: N/A  . Years of Education: N/A   Occupational History  . Not on file.   Social History Main Topics  . Smoking status: Never Smoker   . Smokeless tobacco: Not on file  . Alcohol Use: Yes  . Drug Use: No  . Sexual Activity: Yes    Birth Control/ Protection: None   Other Topics Concern  . Not on file   Social History Narrative  . No narrative on file    No current facility-administered medications on file prior to encounter.   No current outpatient prescriptions on file prior to encounter.    No Known Allergies  ROS Pertinent items in HPI  PHYSICAL EXAM Filed Vitals:   12/12/12 1714  BP: 144/87  Pulse: 92  Temp: 98.2 F (36.8 C)  Resp: 20   General: Well nourished, well developed female in no acute  distress Cardiovascular: Normal rate Respiratory: Normal effort Abdomen: Soft, nontender Back: No CVAT Extremities: No edema Neurologic: Alert and oriented Speculum exam: NEFG; vagina with scant white discharge, no noted malodor, no blood; cervix clean Bimanual exam: cervix closed, no CMT; uterus NSSP; no adnexal tenderness or masses   LAB RESULTS Results for orders placed during the hospital encounter of 12/12/12 (from the past 24 hour(s))  WET PREP, GENITAL     Status: Abnormal   Collection Time    12/12/12  5:38 PM      Result Value Range   Yeast Wet Prep HPF POC NONE SEEN  NONE SEEN   Trich, Wet Prep NONE SEEN  NONE SEEN   Clue Cells Wet Prep HPF POC RARE (*) NONE SEEN   WBC, Wet Prep HPF POC FEW (*) NONE SEEN     ASSESSMENT  No diagnosis found.  PLAN Discharge home. See AVS for patient education.    Medication List    ASK your doctor about these  medications       lactobacillus acidophilus Tabs tablet  Take 2 tablets by mouth 3 (three) times daily.          Danae Orleans, CNM 12/12/2012 5:44 PM

## 2012-12-12 NOTE — MAU Note (Signed)
Patient presents with complaint of vaginal discharge x 1 week.

## 2013-01-11 ENCOUNTER — Other Ambulatory Visit: Payer: Self-pay

## 2013-06-07 ENCOUNTER — Other Ambulatory Visit (HOSPITAL_COMMUNITY)
Admission: RE | Admit: 2013-06-07 | Discharge: 2013-06-07 | Disposition: A | Discharge status: Home / Self Care | Payer: BC Managed Care – PPO | Source: Ambulatory Visit | Attending: Family Medicine | Admitting: Family Medicine

## 2013-06-07 DIAGNOSIS — Z01419 Encounter for gynecological examination (general) (routine) without abnormal findings: Secondary | ICD-10-CM | POA: Diagnosis not present

## 2013-06-07 DIAGNOSIS — Z1151 Encounter for screening for human papillomavirus (HPV): Secondary | ICD-10-CM | POA: Diagnosis present

## 2014-01-07 ENCOUNTER — Encounter (HOSPITAL_COMMUNITY): Payer: Self-pay | Admitting: *Deleted

## 2014-03-25 ENCOUNTER — Ambulatory Visit (INDEPENDENT_AMBULATORY_CARE_PROVIDER_SITE_OTHER): Payer: Self-pay | Admitting: Internal Medicine

## 2014-03-25 DIAGNOSIS — Z024 Encounter for examination for driving license: Secondary | ICD-10-CM

## 2014-03-25 NOTE — Progress Notes (Signed)
   Subjective:    Patient ID: Kaylee Nixon, female    DOB: 17-Aug-1970, 44 y.o.   MRN: [id number]  HPI Healthy, on no meds, feels good.   Review of Systems  Constitutional: Negative.   Eyes: Negative.   Respiratory: Negative.   Cardiovascular: Negative.   Gastrointestinal: Negative.   Endocrine: Negative.   Genitourinary: Negative.   Musculoskeletal: Negative.   Allergic/Immunologic: Negative.   Neurological: Negative.   Hematological: Negative.        Objective:   Physical Exam  Constitutional: She is oriented to person, place, and time. She appears well-developed and well-nourished.  HENT:  Head: Normocephalic and atraumatic.  Right Ear: External ear normal.  Left Ear: External ear normal.  Mouth/Throat: Oropharynx is clear and moist.  Eyes: Conjunctivae and EOM are normal. Pupils are equal, round, and reactive to light.  Neck: Normal range of motion. Neck supple. No thyromegaly present.  Cardiovascular: Normal rate, regular rhythm, normal heart sounds and intact distal pulses.   Pulmonary/Chest: Effort normal and breath sounds normal.  Abdominal: Soft. Bowel sounds are normal. There is no tenderness.  Musculoskeletal: Normal range of motion.  Lymphadenopathy:    She has cervical adenopathy.  Neurological: She is alert and oriented to person, place, and time. She has normal reflexes. No cranial nerve deficit. Coordination normal.  Psychiatric: She has a normal mood and affect. Her behavior is normal. Judgment and thought content normal.  Vitals reviewed.         Assessment & Plan:  Normal 4 year self pay dot

## 2014-03-25 NOTE — Patient Instructions (Signed)
DASH Eating Plan DASH stands for "Dietary Approaches to Stop Hypertension." The DASH eating plan is a healthy eating plan that has been shown to reduce high blood pressure (hypertension). Additional health benefits may include reducing the risk of type 2 diabetes mellitus, heart disease, and stroke. The DASH eating plan may also help with weight loss. WHAT DO I NEED TO KNOW ABOUT THE DASH EATING PLAN? For the DASH eating plan, you will follow these general guidelines:  Choose foods with a percent daily value for sodium of less than 5% (as listed on the food label).  Use salt-free seasonings or herbs instead of table salt or sea salt.  Check with your health care provider or pharmacist before using salt substitutes.  Eat lower-sodium products, often labeled as "lower sodium" or "no salt added."  Eat fresh foods.  Eat more vegetables, fruits, and low-fat dairy products.  Choose whole grains. Look for the word "whole" as the first word in the ingredient list.  Choose fish and skinless chicken or turkey more often than red meat. Limit fish, poultry, and meat to 6 oz (170 g) each day.  Limit sweets, desserts, sugars, and sugary drinks.  Choose heart-healthy fats.  Limit cheese to 1 oz (28 g) per day.  Eat more home-cooked food and less restaurant, buffet, and fast food.  Limit fried foods.  Cook foods using methods other than frying.  Limit canned vegetables. If you do use them, rinse them well to decrease the sodium.  When eating at a restaurant, ask that your food be prepared with less salt, or no salt if possible. WHAT FOODS CAN I EAT? Seek help from a dietitian for individual calorie needs. Grains Whole grain or whole wheat bread. Brown rice. Whole grain or whole wheat pasta. Quinoa, bulgur, and whole grain cereals. Low-sodium cereals. Corn or whole wheat flour tortillas. Whole grain cornbread. Whole grain crackers. Low-sodium crackers. Vegetables Fresh or frozen vegetables  (raw, steamed, roasted, or grilled). Low-sodium or reduced-sodium tomato and vegetable juices. Low-sodium or reduced-sodium tomato sauce and paste. Low-sodium or reduced-sodium canned vegetables.  Fruits All fresh, canned (in natural juice), or frozen fruits. Meat and Other Protein Products Ground beef (85% or leaner), grass-fed beef, or beef trimmed of fat. Skinless chicken or turkey. Ground chicken or turkey. Pork trimmed of fat. All fish and seafood. Eggs. Dried beans, peas, or lentils. Unsalted nuts and seeds. Unsalted canned beans. Dairy Low-fat dairy products, such as skim or 1% milk, 2% or reduced-fat cheeses, low-fat ricotta or cottage cheese, or plain low-fat yogurt. Low-sodium or reduced-sodium cheeses. Fats and Oils Tub margarines without trans fats. Light or reduced-fat mayonnaise and salad dressings (reduced sodium). Avocado. Safflower, olive, or canola oils. Natural peanut or almond butter. Other Unsalted popcorn and pretzels. The items listed above may not be a complete list of recommended foods or beverages. Contact your dietitian for more options. WHAT FOODS ARE NOT RECOMMENDED? Grains White bread. White pasta. White rice. Refined cornbread. Bagels and croissants. Crackers that contain trans fat. Vegetables Creamed or fried vegetables. Vegetables in a cheese sauce. Regular canned vegetables. Regular canned tomato sauce and paste. Regular tomato and vegetable juices. Fruits Dried fruits. Canned fruit in light or heavy syrup. Fruit juice. Meat and Other Protein Products Fatty cuts of meat. Ribs, chicken wings, bacon, sausage, bologna, salami, chitterlings, fatback, hot dogs, bratwurst, and packaged luncheon meats. Salted nuts and seeds. Canned beans with salt. Dairy Whole or 2% milk, cream, half-and-half, and cream cheese. Whole-fat or sweetened yogurt. Full-fat   cheeses or blue cheese. Nondairy creamers and whipped toppings. Processed cheese, cheese spreads, or cheese  curds. Condiments Onion and garlic salt, seasoned salt, table salt, and sea salt. Canned and packaged gravies. Worcestershire sauce. Tartar sauce. Barbecue sauce. Teriyaki sauce. Soy sauce, including reduced sodium. Steak sauce. Fish sauce. Oyster sauce. Cocktail sauce. Horseradish. Ketchup and mustard. Meat flavorings and tenderizers. Bouillon cubes. Hot sauce. Tabasco sauce. Marinades. Taco seasonings. Relishes. Fats and Oils Butter, stick margarine, lard, shortening, ghee, and bacon fat. Coconut, palm kernel, or palm oils. Regular salad dressings. Other Pickles and olives. Salted popcorn and pretzels. The items listed above may not be a complete list of foods and beverages to avoid. Contact your dietitian for more information. WHERE CAN I FIND MORE INFORMATION? National Heart, Lung, and Blood Institute: travelstabloid.com Document Released: 02/11/2011 Document Revised: 07/09/2013 Document Reviewed: 12/27/2012 [facility] Patient Information 2015 [place], [place]. This information is not intended to replace advice given to you by your health care provider. Make sure you discuss any questions you have with your health care provider. Calorie Counting for Weight Loss Calories are energy you get from the things you eat and drink. Your body uses this energy to keep you going throughout the day. The number of calories you eat affects your weight. When you eat more calories than your body needs, your body stores the extra calories as fat. When you eat fewer calories than your body needs, your body burns fat to get the energy it needs. Calorie counting means keeping track of how many calories you eat and drink each day. If you make sure to eat fewer calories than your body needs, you should lose weight. In order for calorie counting to work, you will need to eat the number of calories that are right for you in a day to lose a healthy amount of weight per week. A healthy amount of  weight to lose per week is usually 1-2 lb (0.5-0.9 kg). A dietitian can determine how many calories you need in a day and give you suggestions on how to reach your calorie goal.  WHAT IS MY MY PLAN? My goal is to have __________ calories per day.  If I have this many calories per day, I should lose around __________ pounds per week. WHAT DO I NEED TO KNOW ABOUT CALORIE COUNTING? In order to meet your daily calorie goal, you will need to:  Find out how many calories are in each food you would like to eat. Try to do this before you eat.  Decide how much of the food you can eat.  Write down what you ate and how many calories it had. Doing this is called keeping a food log. WHERE DO I FIND CALORIE INFORMATION? The number of calories in a food can be found on a Nutrition Facts label. Note that all the information on a label is based on a specific serving of the food. If a food does not have a Nutrition Facts label, try to look up the calories online or ask your dietitian for help. HOW DO I DECIDE HOW MUCH TO EAT? To decide how much of the food you can eat, you will need to consider both the number of calories in one serving and the size of one serving. This information can be found on the Nutrition Facts label. If a food does not have a Nutrition Facts label, look up the information online or ask your dietitian for help. Remember that calories are listed per serving. If you  choose to have more than one serving of a food, you will have to multiply the calories per serving by the amount of servings you plan to eat. For example, the label on a package of bread might say that a serving size is 1 slice and that there are 90 calories in a serving. If you eat 1 slice, you will have eaten 90 calories. If you eat 2 slices, you will have eaten 180 calories. HOW DO I KEEP A FOOD LOG? After each meal, record the following information in your food log:  What you ate.  How much of it you ate.  How many calories  it had.  Then, add up your calories. Keep your food log near you, such as in a small notebook in your pocket. Another option is to use a mobile app or website. Some programs will calculate calories for you and show you how many calories you have left each time you add an item to the log. WHAT ARE SOME CALORIE COUNTING TIPS?  Use your calories on foods and drinks that will fill you up and not leave you hungry. Some examples of this include foods like nuts and nut butters, vegetables, lean proteins, and high-fiber foods (more than 5 g fiber per serving).  Eat nutritious foods and avoid empty calories. Empty calories are calories you get from foods or beverages that do not have many nutrients, such as candy and soda. It is better to have a nutritious high-calorie food (such as an avocado) than a food with few nutrients (such as a bag of chips).  Know how many calories are in the foods you eat most often. This way, you do not have to look up how many calories they have each time you eat them.  Look out for foods that may seem like low-calorie foods but are really high-calorie foods, such as baked goods, soda, and fat-free candy.  Pay attention to calories in drinks. Drinks such as sodas, specialty coffee drinks, alcohol, and juices have a lot of calories yet do not fill you up. Choose low-calorie drinks like water and diet drinks.  Focus your calorie counting efforts on higher calorie items. Logging the calories in a garden salad that contains only vegetables is less important than calculating the calories in a milk shake.  Find a way of tracking calories that works for you. Get creative. Most people who are successful find ways to keep track of how much they eat in a day, even if they do not count every calorie. WHAT ARE SOME PORTION CONTROL TIPS?  Know how many calories are in a serving. This will help you know how many servings of a certain food you can have.  Use a measuring cup to measure  serving sizes. This is helpful when you start out. With time, you will be able to estimate serving sizes for some foods.  Take some time to put servings of different foods on your favorite plates, bowls, and cups so you know what a serving looks like.  Try not to eat straight from a bag or box. Doing this can lead to overeating. Put the amount you would like to eat in a cup or on a plate to make sure you are eating the right portion.  Use smaller plates, glasses, and bowls to prevent overeating. This is a quick and easy way to practice portion control. If your plate is smaller, less food can fit on it.  Try not to multitask while eating, such  as watching TV or using your computer. If it is time to eat, sit down at a table and enjoy your food. Doing this will help you to start recognizing when you are full. It will also make you more aware of what and how much you are eating. HOW CAN I CALORIE COUNT WHEN EATING OUT?  Ask for smaller portion sizes or child-sized portions.  Consider sharing an entree and sides instead of getting your own entree.  If you get your own entree, eat only half. Ask for a box at the beginning of your meal and put the rest of your entree in it so you are not tempted to eat it.  Look for the calories on the menu. If calories are listed, choose the lower calorie options.  Choose dishes that include vegetables, fruits, whole grains, low-fat dairy products, and lean protein. Focusing on smart food choices from each of the 5 food groups can help you stay on track at restaurants.  Choose items that are boiled, broiled, grilled, or steamed.  Choose water, milk, unsweetened iced tea, or other drinks without added sugars. If you want an alcoholic beverage, choose a lower calorie option. For example, a regular margarita can have up to 700 calories and a glass of wine has around 150.  Stay away from items that are buttered, battered, fried, or served with cream sauce. Items  labeled "crispy" are usually fried, unless stated otherwise.  Ask for dressings, sauces, and syrups on the side. These are usually very high in calories, so do not eat much of them.  Watch out for salads. Many people think salads are a healthy option, but this is often not the case. Many salads come with bacon, fried chicken, lots of cheese, fried chips, and dressing. All of these items have a lot of calories. If you want a salad, choose a garden salad and ask for grilled meats or steak. Ask for the dressing on the side, or ask for olive oil and vinegar or lemon to use as dressing.  Estimate how many servings of a food you are given. For example, a serving of cooked rice is  cup or about the size of half a tennis ball or one cupcake wrapper. Knowing serving sizes will help you be aware of how much food you are eating at restaurants. The list below tells you how big or small some common portion sizes are based on everyday objects.  1 oz--4 stacked dice.  3 oz--1 deck of cards.  1 tsp--1 dice.  1 Tbsp-- a Ping-Pong ball.  2 Tbsp--1 Ping-Pong ball.   cup--1 tennis ball or 1 cupcake wrapper.  1 cup--1 baseball. Document Released: 02/22/2005 Document Revised: 07/09/2013 Document Reviewed: 12/28/2012 [facility] Patient Information 2015 [place], [place]. This information is not intended to replace advice given to you by your health care provider. Make sure you discuss any questions you have with your health care provider.

## 2014-12-02 ENCOUNTER — Ambulatory Visit (INDEPENDENT_AMBULATORY_CARE_PROVIDER_SITE_OTHER): Payer: 59

## 2014-12-02 ENCOUNTER — Ambulatory Visit (INDEPENDENT_AMBULATORY_CARE_PROVIDER_SITE_OTHER): Payer: 59 | Admitting: Family Medicine

## 2014-12-02 VITALS — BP 126/84 | HR 92 | Temp 97.8°F | Resp 18 | Ht 64.5 in | Wt 246.0 lb

## 2014-12-02 DIAGNOSIS — S80911A Unspecified superficial injury of right knee, initial encounter: Secondary | ICD-10-CM

## 2014-12-02 DIAGNOSIS — W19XXXA Unspecified fall, initial encounter: Secondary | ICD-10-CM

## 2014-12-02 IMAGING — CR DG KNEE 1-2V*R*
2 series · 2 of 2 positions shown · non-contrast
Comparison: MRI of [DATE].

CLINICAL DATA: Fall today.  Knee gave out.

EXAM:
RIGHT KNEE - 1-2 VIEW

[AP]
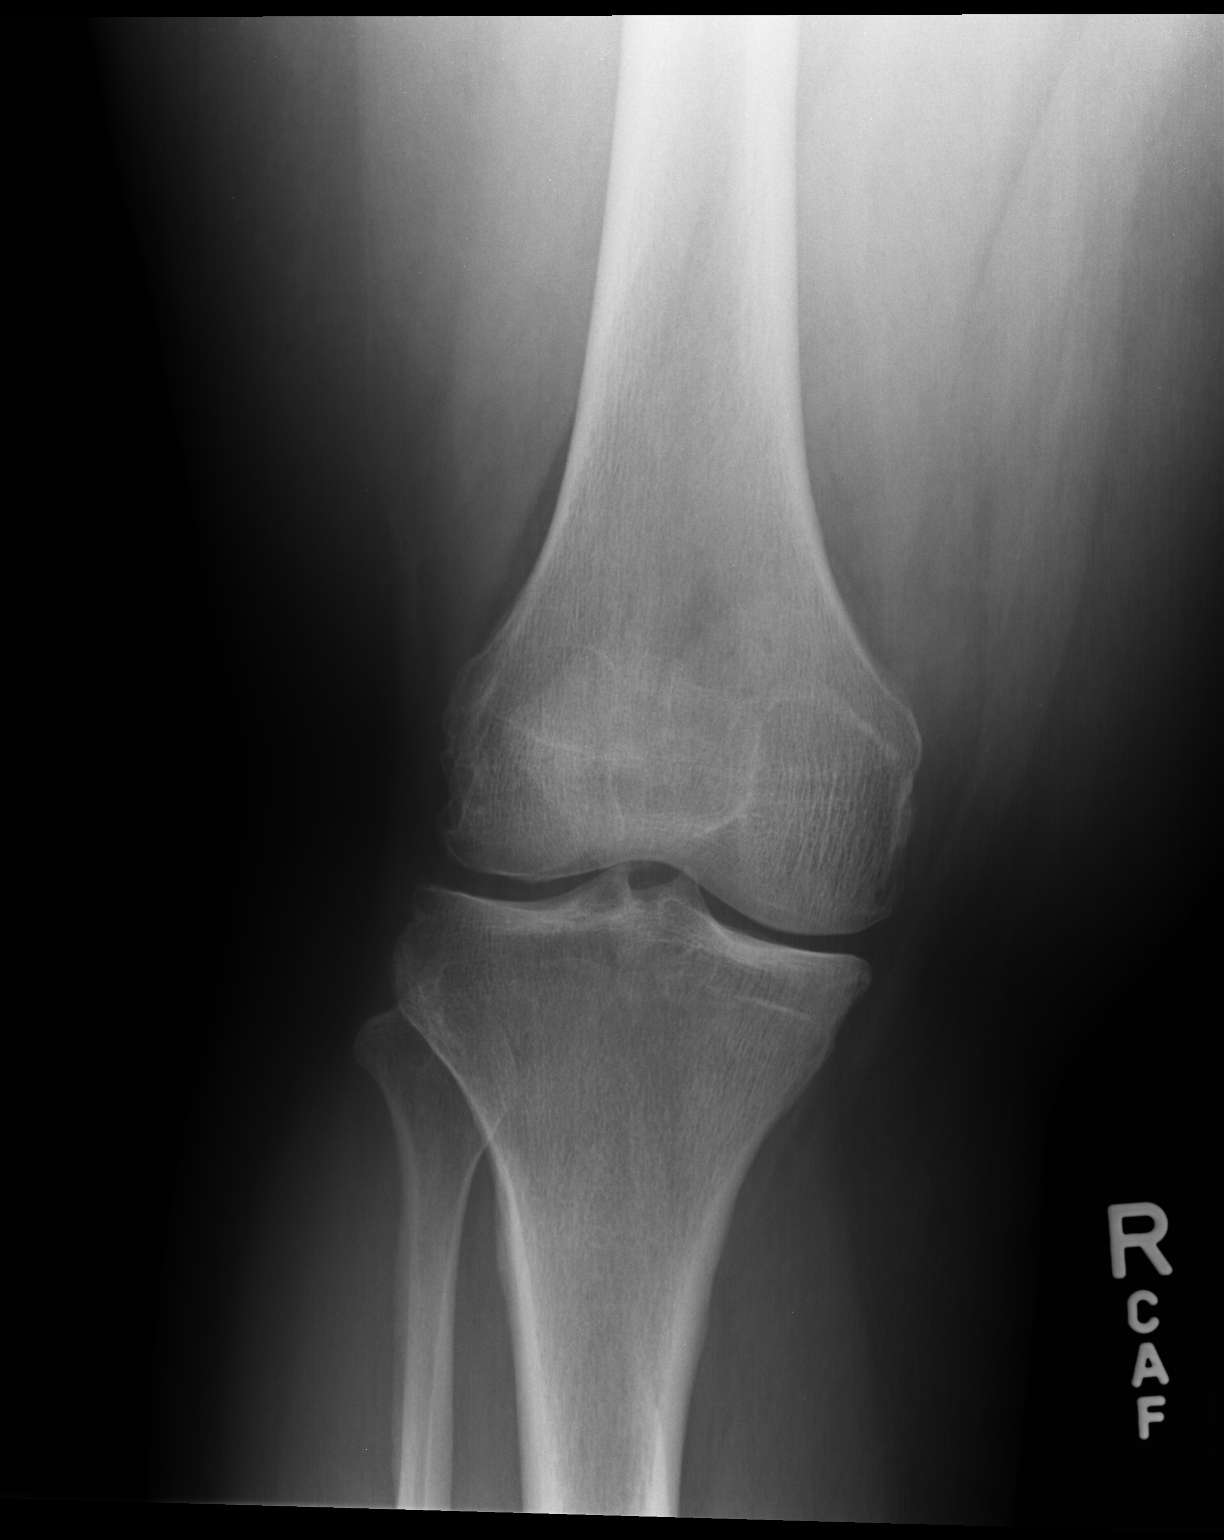

[lateral]
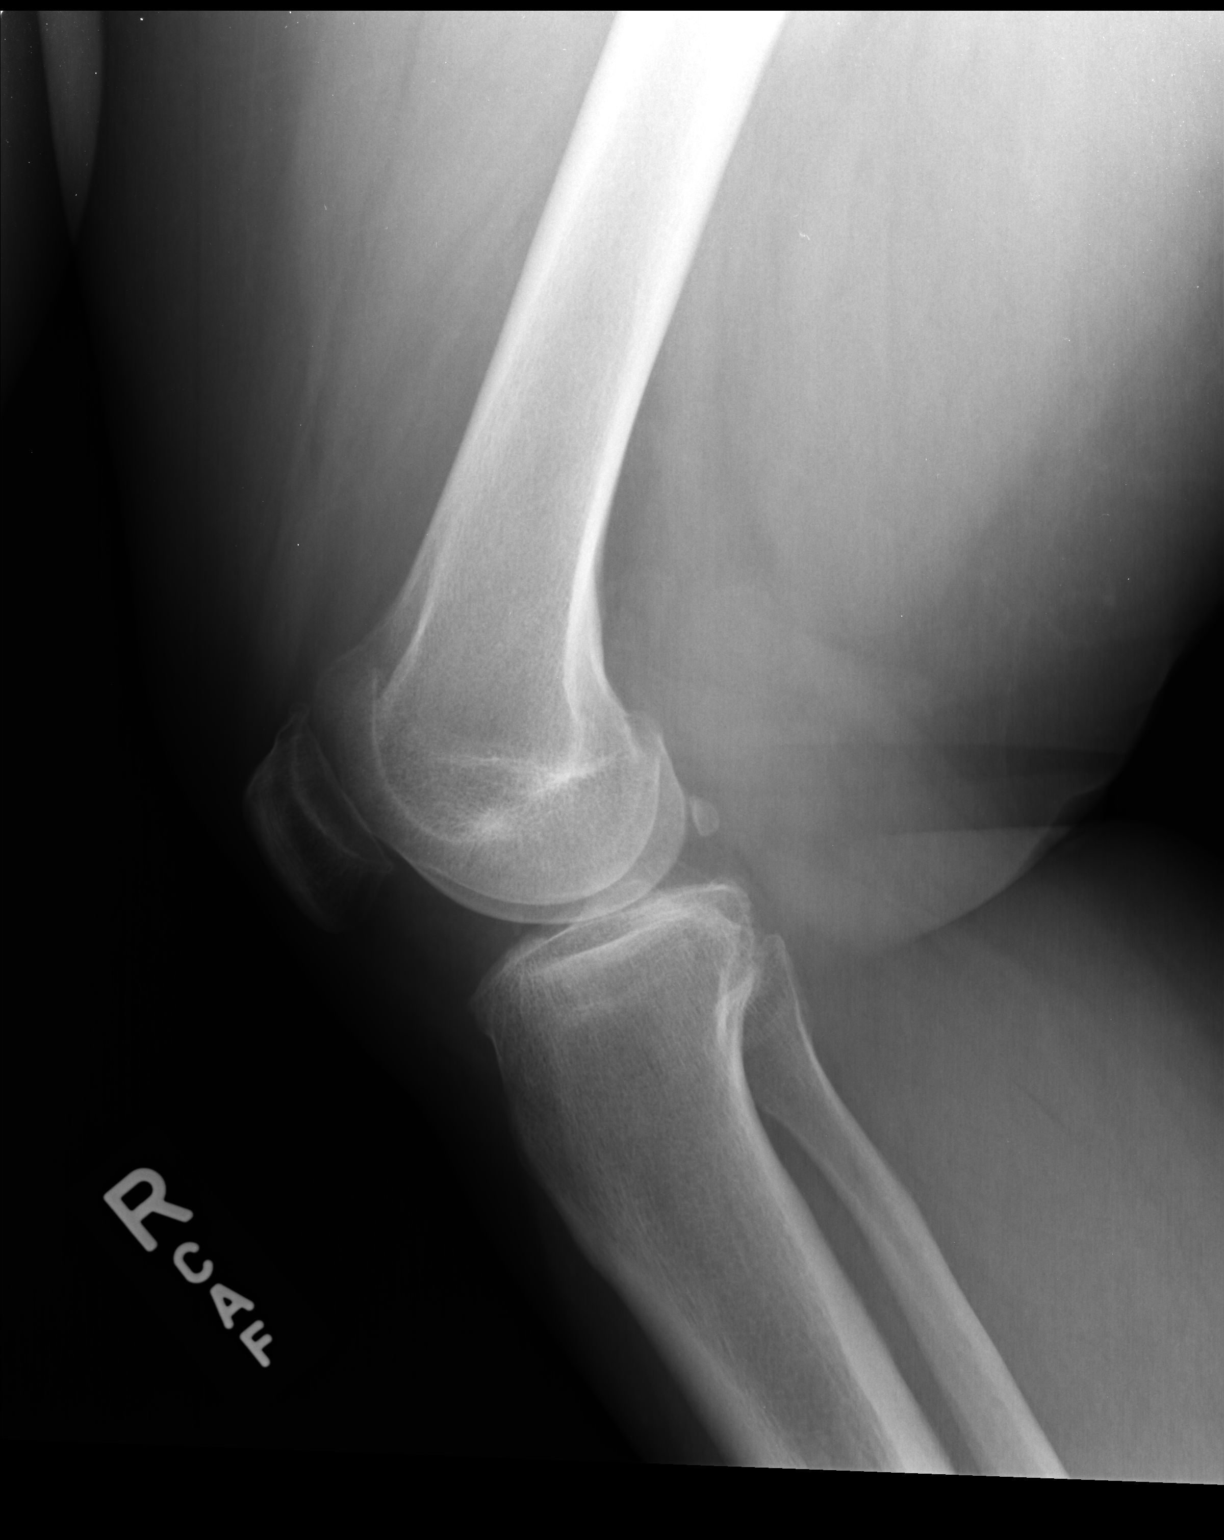

[2 of 2 positions shown; findings below may reference images not displayed]

FINDINGS: Mild but age advanced medial and patellofemoral compartment joint
space narrowing and subchondral sclerosis. No acute fracture or
dislocation. No joint effusion.
IMPRESSION: Degenerative change, without acute osseous finding.

## 2014-12-02 NOTE — Patient Instructions (Signed)
We see a small piece of cartilage in front of the knee that is probably responsible her falling down today. Will getting an MRI to further evaluate this and see what can be done. In the meantime, use the knee brace to prevent further falls.

## 2014-12-02 NOTE — Progress Notes (Signed)
This chart was scribed for Robyn Haber, MD by Moises Blood, medical scribe at [facility].The patient was seen in exam room 3 and the patient's care was started at 5:24 PM.  Patient ID: Kaylee Nixon MRN: [id number], DOB: 1970/03/21, 44 y.o. Date of Encounter: 12/02/2014  Primary Physician: No primary care provider on file.  Chief Complaint:  Chief Complaint  Patient presents with  . Fall    Pt. fell today at work around 145-2p. Does not remember falling.     HPI:  Kaylee Nixon is a 44 y.o. female who presents to [facility] complaining of falling.  She was working earlier today, she fell and landed on her right side. She remembers falling, but denies knowing how; she just collapsed.  Her coworkers were worried that her BP might be high and that caused the fall.   She previously had right knee surgery. She feels worse than before the surgery. She's not able to get back into her old shoes.   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   Past Medical History  Diagnosis Date  . No pertinent past medical history   . Medical history non-contributory      Home Meds: Prior to Admission medications   Medication Sig Start Date End Date Taking? Authorizing Provider  lactobacillus acidophilus (BACID) TABS tablet Take 2 tablets by mouth 3 (three) times daily.    Historical Provider, MD  metroNIDAZOLE (FLAGYL) 500 MG tablet Take 1 tablet (500 mg total) by mouth every 12 (twelve) hours. Patient not taking: Reported on 03/25/2014 12/12/12   Deirdre Freida Busman, CNM    Allergies: No Known Allergies  Social History   Social History  . Marital Status: Single    Spouse Name: N/A  . Number of Children: N/A  . Years of Education: N/A   Occupational History  . Not on file.   Social History Main Topics  . Smoking status: Never Smoker   . Smokeless tobacco: Not on file  . Alcohol Use: Yes  . Drug Use: No  . Sexual Activity: Yes    Birth  Control/ Protection: None   Other Topics Concern  . Not on file   Social History Narrative     Review of Systems: Constitutional: negative for chills, fever, night sweats, weight changes, or fatigue  HEENT: negative for vision changes, hearing loss, congestion, rhinorrhea, ST, epistaxis, or sinus pressure Cardiovascular: negative for chest pain or palpitations Respiratory: negative for hemoptysis, wheezing, shortness of breath, or cough Abdominal: negative for abdominal pain, nausea, vomiting, diarrhea, or constipation Dermatological: negative for rash Neurologic: negative for headache, dizziness, or syncope All other systems reviewed and are otherwise negative with the exception to those above and in the HPI.  Physical Exam: Blood pressure 126/84, pulse 92, temperature 97.8 F (36.6 C), temperature source Oral, resp. rate 18, height 5' 4.5" (1.638 m), weight 246 lb (111.585 kg), last menstrual period 11/23/2014, SpO2 94 %., Body mass index is 41.59 kg/(m^2). General: Well developed, well nourished, in no acute distress. Head: Normocephalic, atraumatic, eyes without discharge, sclera non-icteric, nares are without discharge. Bilateral auditory canals clear, TM's are without perforation, pearly grey and translucent with reflective cone of light bilaterally. Oral cavity moist, posterior pharynx without exudate, erythema, peritonsillar abscess, or post nasal drip.  Neck: Supple. No thyromegaly. Full ROM. No lymphadenopathy. Lungs: Clear bilaterally to auscultation without wheezes, rales, or rhonchi. Breathing is unlabored. Heart: RRR with S1 S2. No murmurs, rubs, or  gallops appreciated. Abdomen: Soft, non-tender, non-distended with normoactive bowel sounds. No hepatomegaly. No rebound/guarding. No obvious abdominal masses. Msk:  Strength and tone normal for age. Extremities/Skin: Warm and dry. No clubbing or cyanosis. No edema. No rashes or suspicious lesions. Full range of motion of the  knee with no ligamentous laxity. Patient does have significant crepitus with flexion and extension. There is no effusion. Neuro: Alert and oriented X 3. Moves all extremities spontaneously. Gait is normal. CNII-XII grossly in tact. Psych:  Responds to questions appropriately with a normal affect.   Labs: [facility] reading (PRIMARY) by Dr. Joseph Art : Right knee: xray shows small irregular density anterior 1/3 knee joint  ASSESSMENT AND PLAN:  44 y.o. year old female with probable loose body in the anterior aspect of his knee. This chart was scribed in my presence and reviewed by me personally.    ICD-9-CM ICD-10-CM   1. Fall, initial encounter [phone] W19.XXXA DG Knee 1-2 Views Right     MR Knee Right Wo Contrast    By signing my name below, I, Moises Blood, attest that this documentation has been prepared under the direction and in the presence of Robyn Haber, MD. Electronically Signed: Moises Blood, [place]. 12/02/2014 , 5:24 PM .  Signed, Robyn Haber, MD 12/02/2014 5:24 PM

## 2014-12-05 ENCOUNTER — Other Ambulatory Visit: Payer: Self-pay | Admitting: Family Medicine

## 2014-12-05 DIAGNOSIS — S8001XA Contusion of right knee, initial encounter: Secondary | ICD-10-CM

## 2016-03-23 ENCOUNTER — Ambulatory Visit (INDEPENDENT_AMBULATORY_CARE_PROVIDER_SITE_OTHER): Payer: Self-pay | Admitting: Physician Assistant

## 2016-03-23 ENCOUNTER — Telehealth: Payer: Self-pay | Admitting: Family Medicine

## 2016-03-23 VITALS — BP 130/86 | HR 85 | Temp 97.9°F | Resp 18 | Ht 63.0 in | Wt 254.6 lb

## 2016-03-23 DIAGNOSIS — Z0289 Encounter for other administrative examinations: Secondary | ICD-10-CM

## 2016-03-23 NOTE — Telephone Encounter (Signed)
I'm glad she called.  With a history of sleep apea with no documentation assumed to be active she will need to see her PCP and will likely need another sleep study  to confirm or deny the diagnosis of sleep apnea. If she does have sleep apnea she will have to prove that she is wearing her cpap in order to maintain her certification.  Philis Fendt, MS, PA-C 5:12 PM, 03/23/2016

## 2016-03-23 NOTE — Telephone Encounter (Signed)
Pt states that she has to get records of a sleep study and that she went to [facility] to get records they said that it was over 10 years they don't have it and pt want to know what else could she do since she cant get records

## 2016-03-23 NOTE — Progress Notes (Signed)
Commercial Driver Medical Examination   Kaylee Nixon is a 46 y.o. female who presents today for a commercial driver fitness determination physical exam. The patient reports no problems today. In the past the patient reports receiving 2 year certificates. She denies focal neurological deficits, vision and hearing changes. She denies the habitual use of benzodiazepines and opioids. She reports a history of sleep apnea on her long form.  She does not wear a CPAP and refuses to do this.  She has no documentation of this diagnosis and none exist in [facility].   Past Medical History:  Diagnosis Date  . Medical history non-contributory   . No pertinent past medical history     Current medications, family history, allergies, social history reviewed by me and exist elsewhere in the encounter.   Review of Systems  Constitutional: Negative for chills and fever.  Eyes: Negative for blurred vision.  Respiratory: Negative for cough and shortness of breath.   Cardiovascular: Negative for chest pain.  Gastrointestinal: Negative for abdominal pain and nausea.  Genitourinary: Negative for dysuria, frequency and urgency.  Musculoskeletal: Negative for myalgias.  Skin: Negative for rash.  Neurological: Negative for dizziness, tingling and headaches.  Psychiatric/Behavioral: Negative for depression. The patient is not nervous/anxious.     Objective:     Vision/hearing:  Visual Acuity Screening   Right eye Left eye Both eyes  Without correction:     With correction: 20/20 20/20 20/20   Comments: The patient can distinguish the colors red, amber and green  Peripheral Vision: Right eye 85 degrees. Left eye 85 degrees.  Hearing Screening Comments: The patient was able to hear a forced whisper from 10 feet.  Applicant can recognize and distinguish among traffic control signals and devices showing standard red, green, and amber colors.  Corrective lenses required: Yes  Monocular Vision?: No  Hearing  aid requirement: No  Physical Exam  Constitutional: She is oriented to person, place, and time. She appears well-nourished. No distress.  Eyes: EOM are normal. Pupils are equal, round, and reactive to light.  Cardiovascular: Normal rate and regular rhythm.   Pulmonary/Chest: Effort normal and breath sounds normal.  Abdominal: She exhibits no distension.  Musculoskeletal: Normal range of motion.  Neurological: She is alert and oriented to person, place, and time. She has normal strength and normal reflexes. No cranial nerve deficit or sensory deficit. Gait normal. GCS eye subscore is 4. GCS verbal subscore is 5. GCS motor subscore is 6.  Skin: Skin is dry. She is not diaphoretic.  Psychiatric: She has a normal mood and affect.  Vitals reviewed.   BP 130/86 (BP Location: Right Arm, Patient Position: Sitting, Cuff Size: Large)   Pulse 85   Temp 97.9 F (36.6 C) (Oral)   Resp 18   Ht 5\' 3"  (1.6 m)   Wt 254 lb 9.6 oz (115.5 kg)   SpO2 100%   BMI 45.10 kg/m   Labs: Comments: UA  Prot. zero; SG 1.030; Blood large; Gluc. zero.  Assessment:    Healthy female exam. She is on her menses.   Meets standards, but periodic monitoring required due to self reported history of sleep apnea.  Driver qualified only for 3 months.    Plan:    Medical examiners certificate completed and printed. Return as needed.

## 2016-05-25 ENCOUNTER — Other Ambulatory Visit: Payer: Self-pay | Admitting: Family Medicine

## 2016-05-25 ENCOUNTER — Other Ambulatory Visit (HOSPITAL_COMMUNITY)
Admission: RE | Admit: 2016-05-25 | Discharge: 2016-05-25 | Disposition: A | Discharge status: Home / Self Care | Payer: BLUE CROSS/BLUE SHIELD | Source: Ambulatory Visit | Attending: Family Medicine | Admitting: Family Medicine

## 2016-05-25 DIAGNOSIS — Z1151 Encounter for screening for human papillomavirus (HPV): Secondary | ICD-10-CM | POA: Diagnosis not present

## 2016-05-25 DIAGNOSIS — Z01411 Encounter for gynecological examination (general) (routine) with abnormal findings: Secondary | ICD-10-CM | POA: Diagnosis present

## 2016-05-27 LAB — CYTOLOGY - PAP
Diagnosis: NEGATIVE
HPV: NOT DETECTED

## 2016-06-29 ENCOUNTER — Other Ambulatory Visit (HOSPITAL_COMMUNITY): Payer: Self-pay | Admitting: Surgery

## 2016-07-14 ENCOUNTER — Ambulatory Visit (HOSPITAL_COMMUNITY)
Admission: RE | Admit: 2016-07-14 | Discharge: 2016-07-14 | Disposition: A | Discharge status: Home / Self Care | Payer: BLUE CROSS/BLUE SHIELD | Source: Ambulatory Visit | Attending: Surgery | Admitting: Surgery

## 2016-07-14 IMAGING — RF DG UGI W/ KUB
10 series · 10 of 10 positions shown · non-contrast
Comparison: None

CLINICAL DATA: Pre bariatric surgery screening

EXAM:
UPPER GI SERIES WITH KUB
TECHNIQUE: After obtaining a scout radiograph a routine upper GI series was
performed using thin barium
FLUOROSCOPY TIME:  Fluoroscopy Time:  0.6 minutes
Radiation Exposure Index (if provided by the fluoroscopic device):
19.8
Number of Acquired Spot Images: 0

[Series 1: t abdomen supine · 0.15mm/px · 1 of 1 slices shown]
[im 1/1]
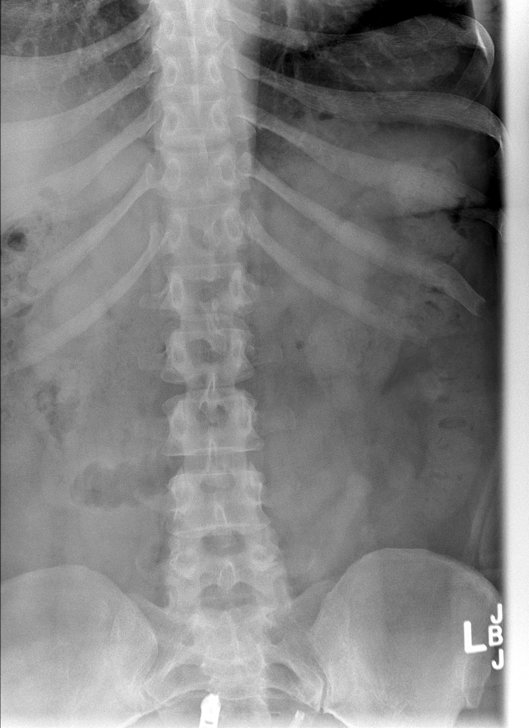

[Series 2: cp_standard · 0.26mm/px · 1 of 1 slices shown (1 of 9)]
[im 1/1]
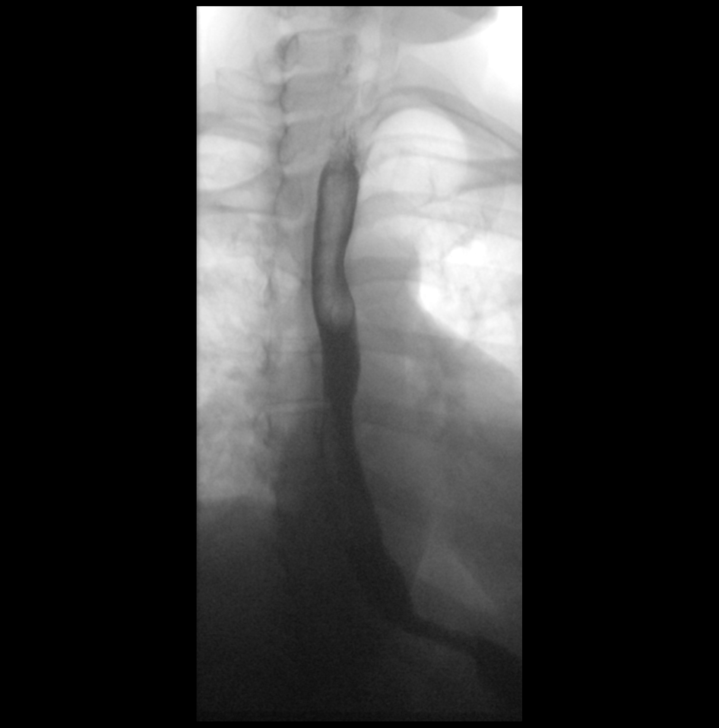

[Series 3: cp_standard · 0.26mm/px · 1 of 1 slices shown (2 of 9)]
[im 1/1]
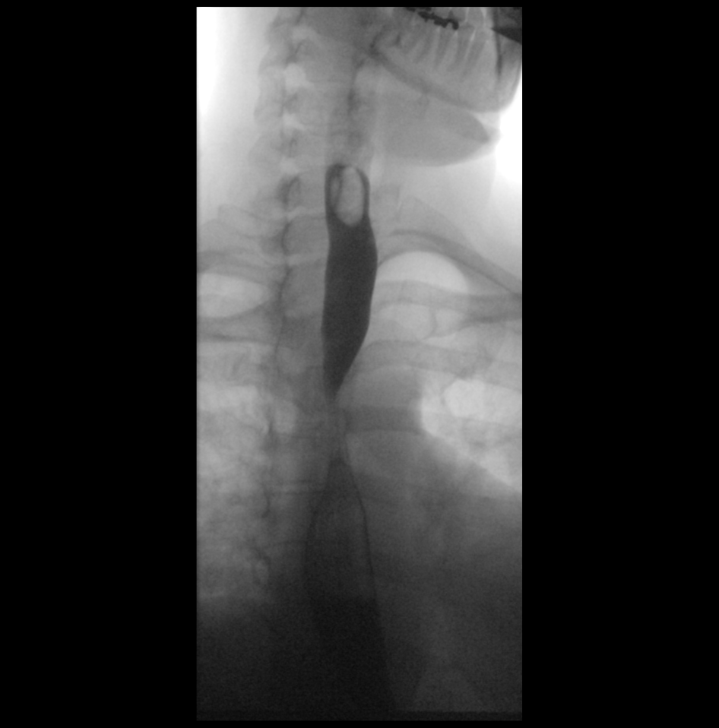

[Series 4: cp_standard · 0.26mm/px · 1 of 1 slices shown (3 of 9)]
[im 1/1]
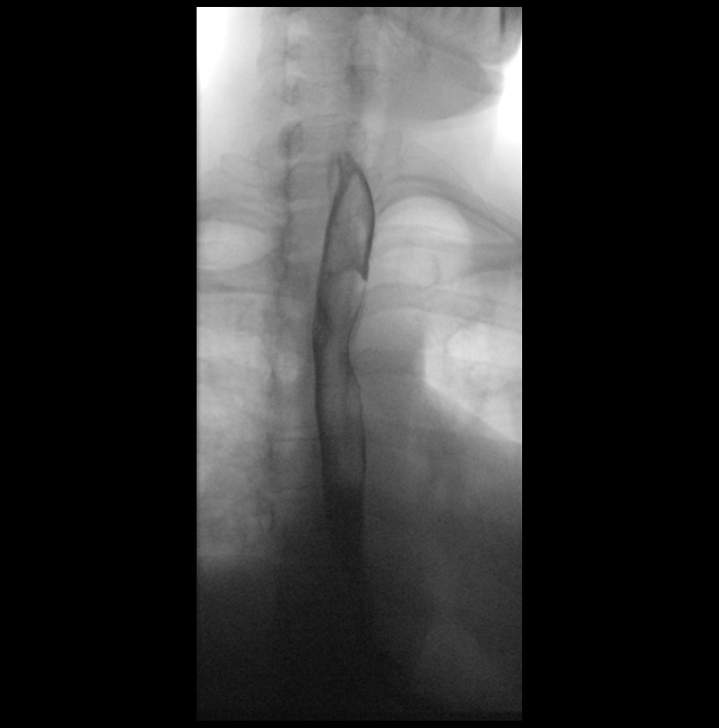

[Series 5: cp_standard · 0.26mm/px · 1 of 1 slices shown (4 of 9)]
[im 1/1]
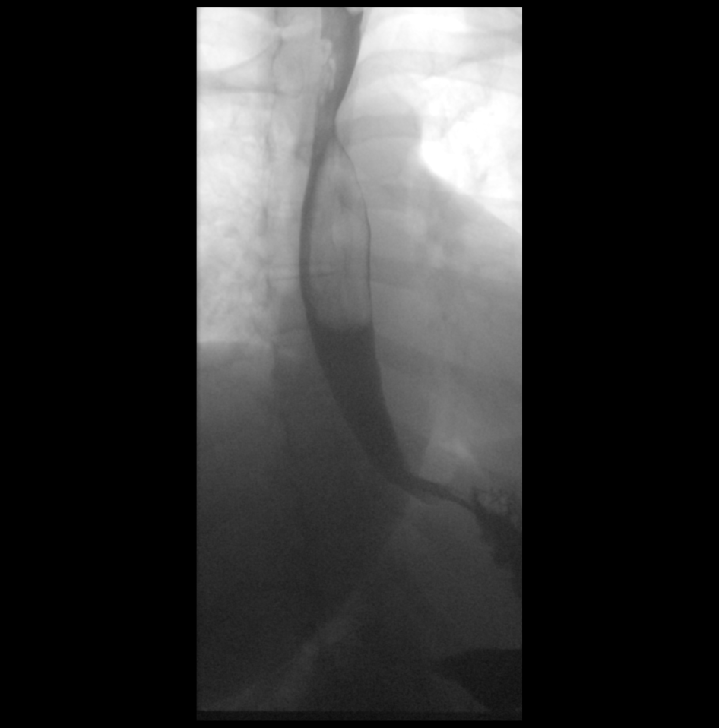

[Series 6: cp_standard · 0.26mm/px · 1 of 1 slices shown (5 of 9)]
[im 1/1]
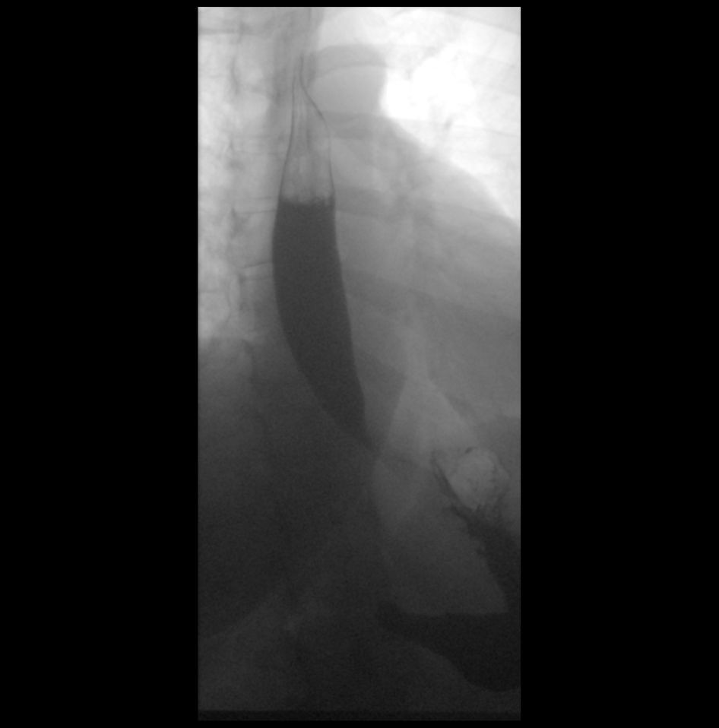

[Series 7: cp_standard · 0.27mm/px · 1 of 1 slices shown (6 of 9)]
[im 1/1]
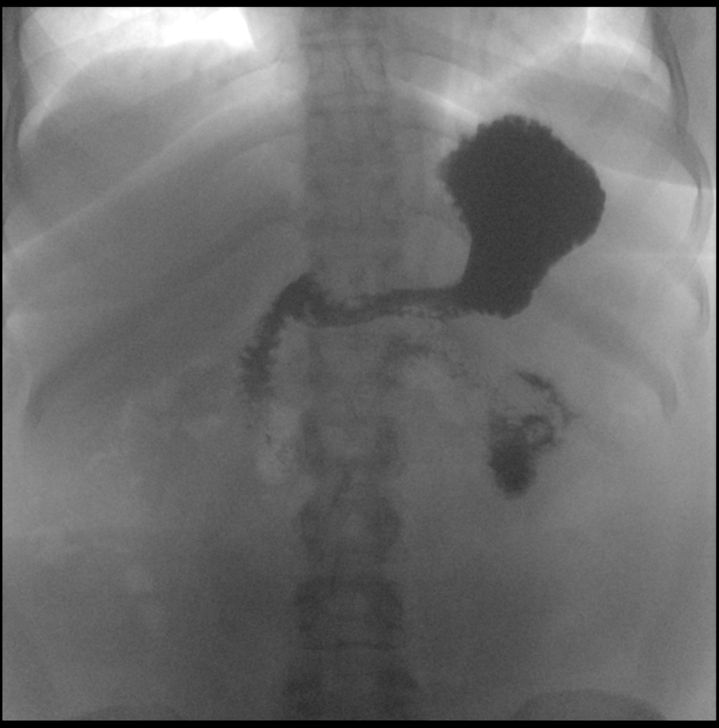

[Series 8: cp_standard · 0.27mm/px · 1 of 1 slices shown (7 of 9)]
[im 1/1]
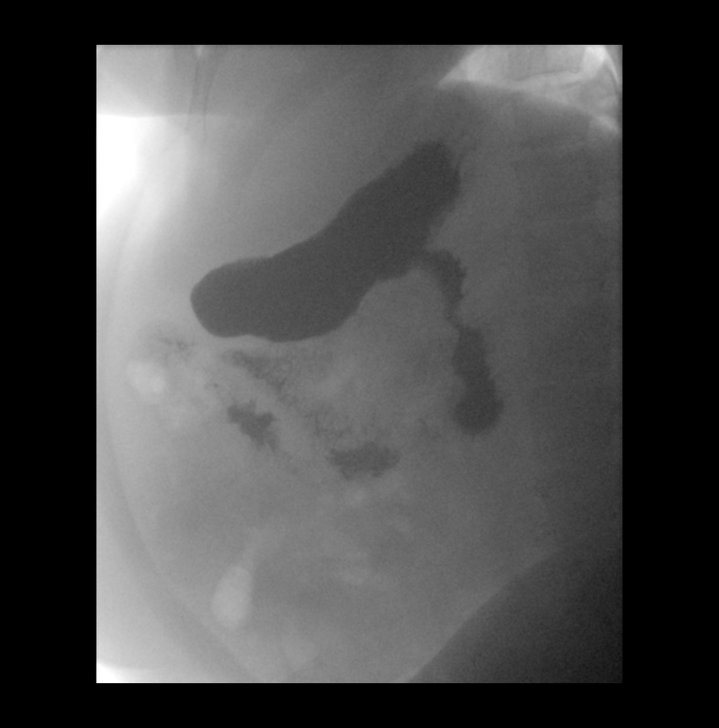

[Series 9: cp_standard · 0.27mm/px · 1 of 1 slices shown (8 of 9)]
[im 1/1]
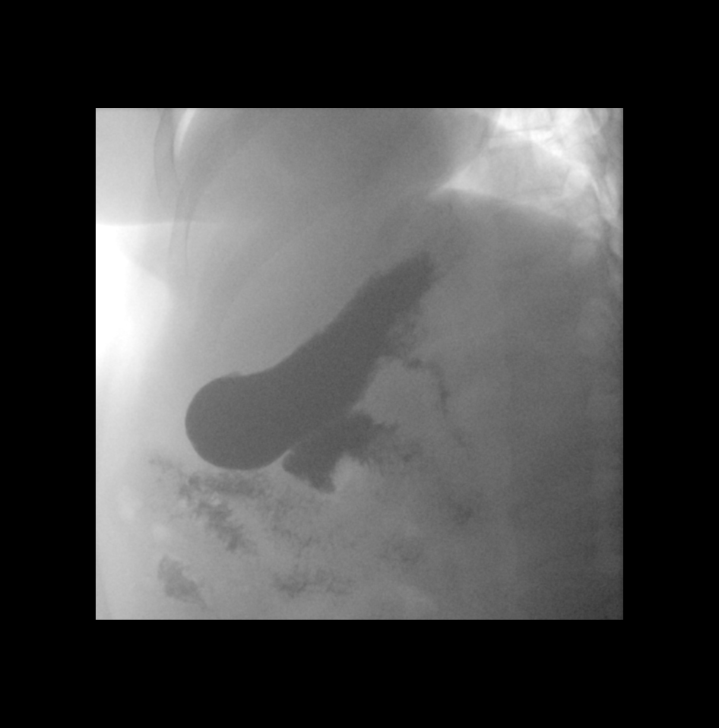

[Series 10: cp_standard · 0.19mm/px · 1 of 1 slices shown (9 of 9)]
[im 1/1]
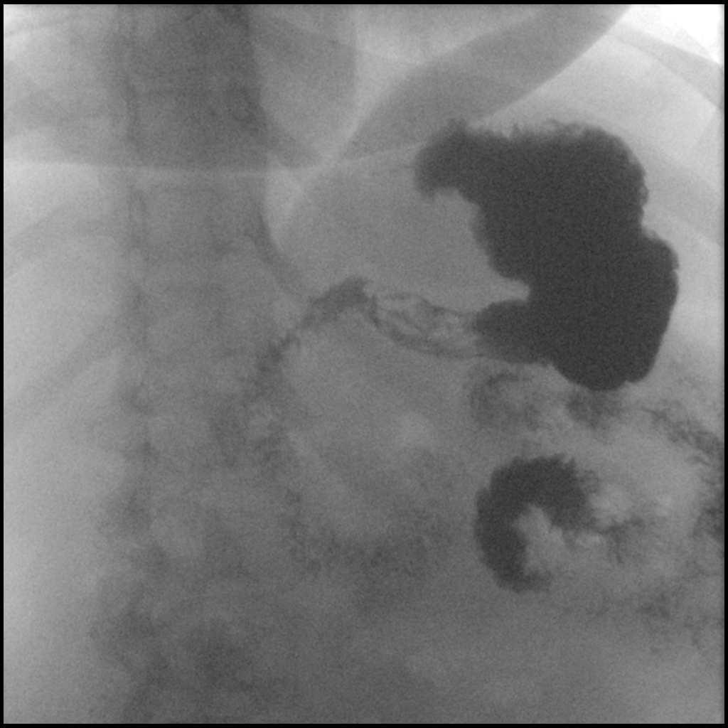

[10 of 10 positions shown; findings below may reference images not displayed]

FINDINGS: The esophagus appears normal in course and caliber. No stricture or
mass identified. The motility of the esophagus is normal.

The stomach, duodenal bulb and sweep are normal in configuration. No
hiatal hernia identified. No reflux.
IMPRESSION: 1. Normal upper GI exam for pre bariatric screening.

## 2016-07-14 IMAGING — CR DG CHEST 2V
2 series · 2 of 2 positions shown · non-contrast
Comparison: Radiograph [DATE].

CLINICAL DATA: Morbid obesity.  Preop for bariatric surgery.

EXAM:
CHEST  2 VIEW

[w chest pa]
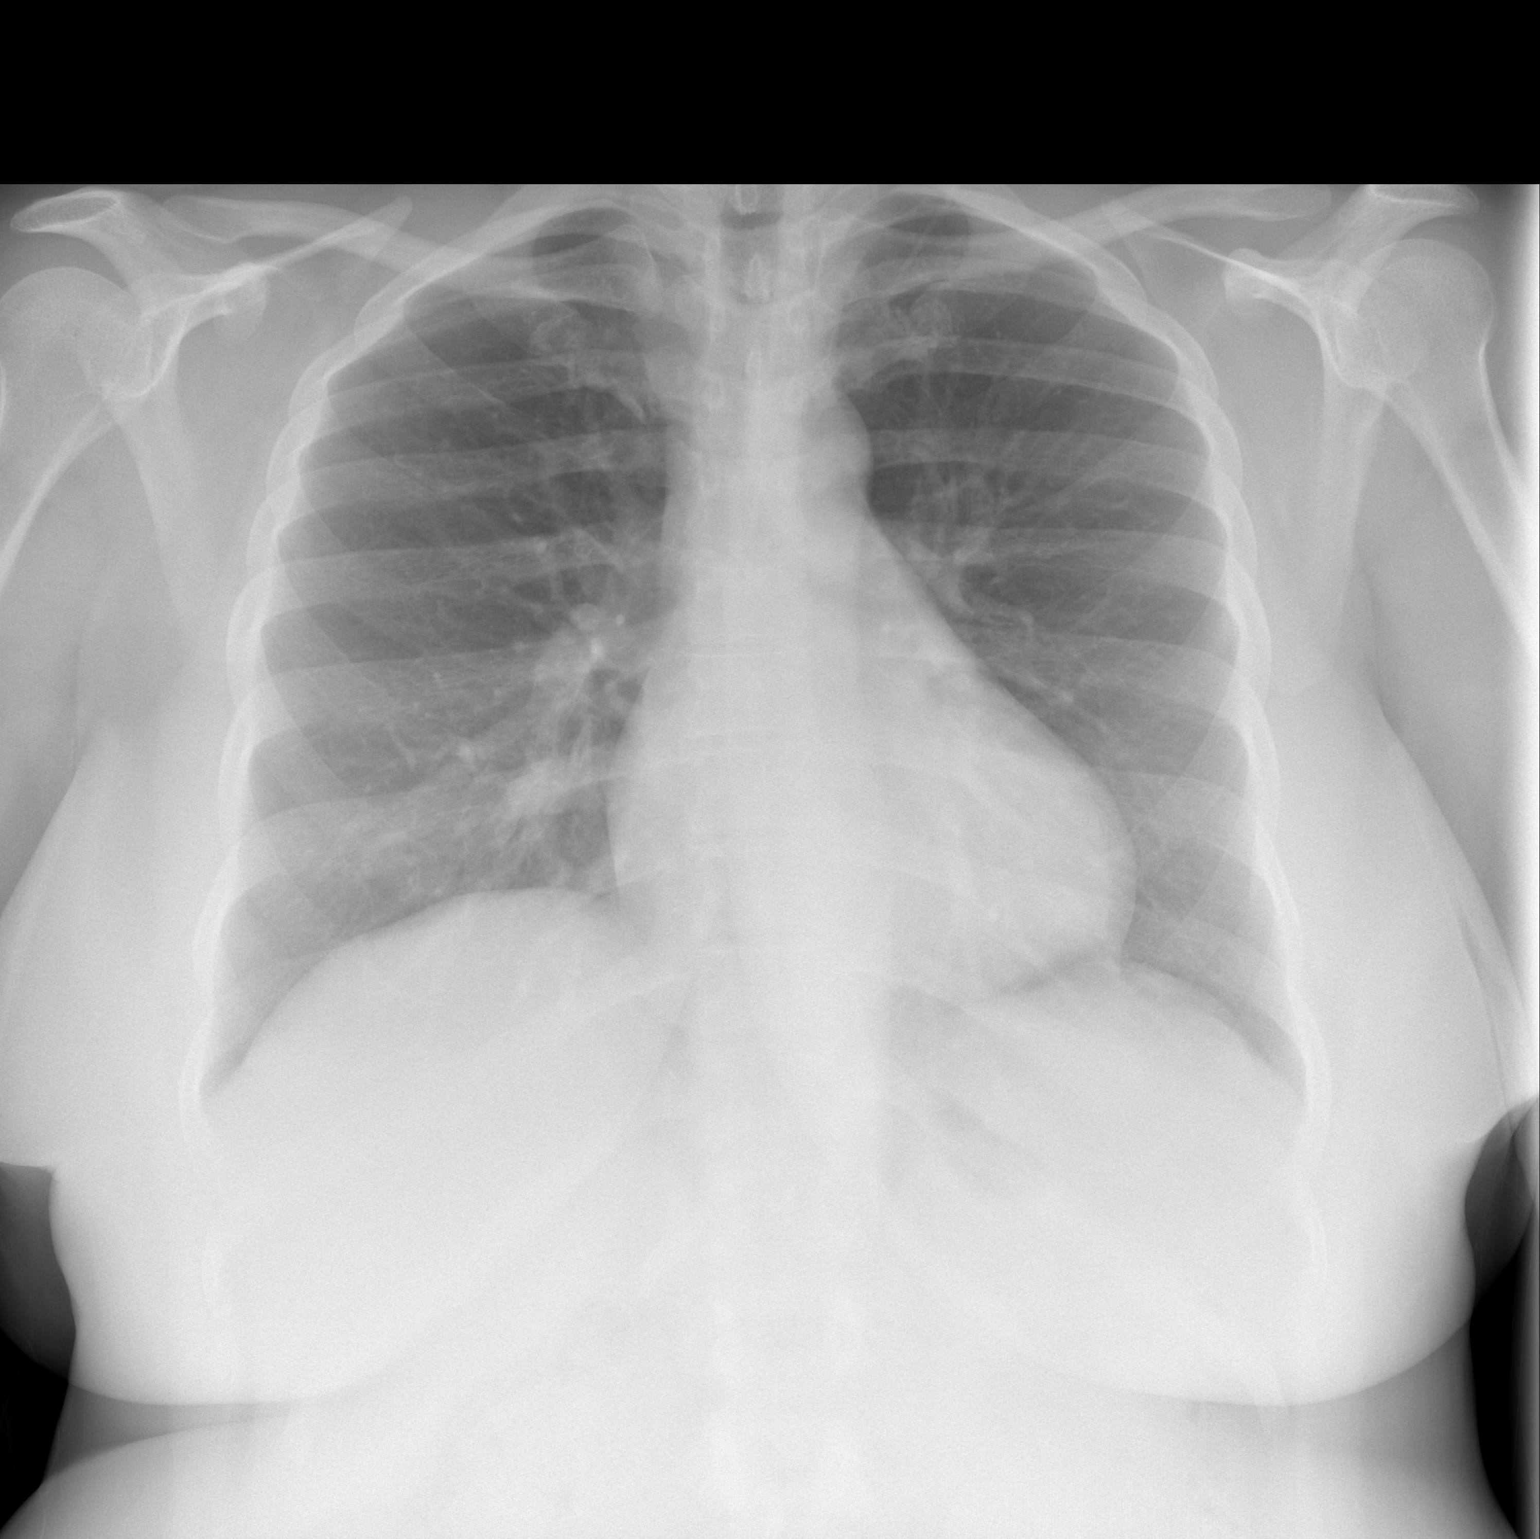

[w chest lat]
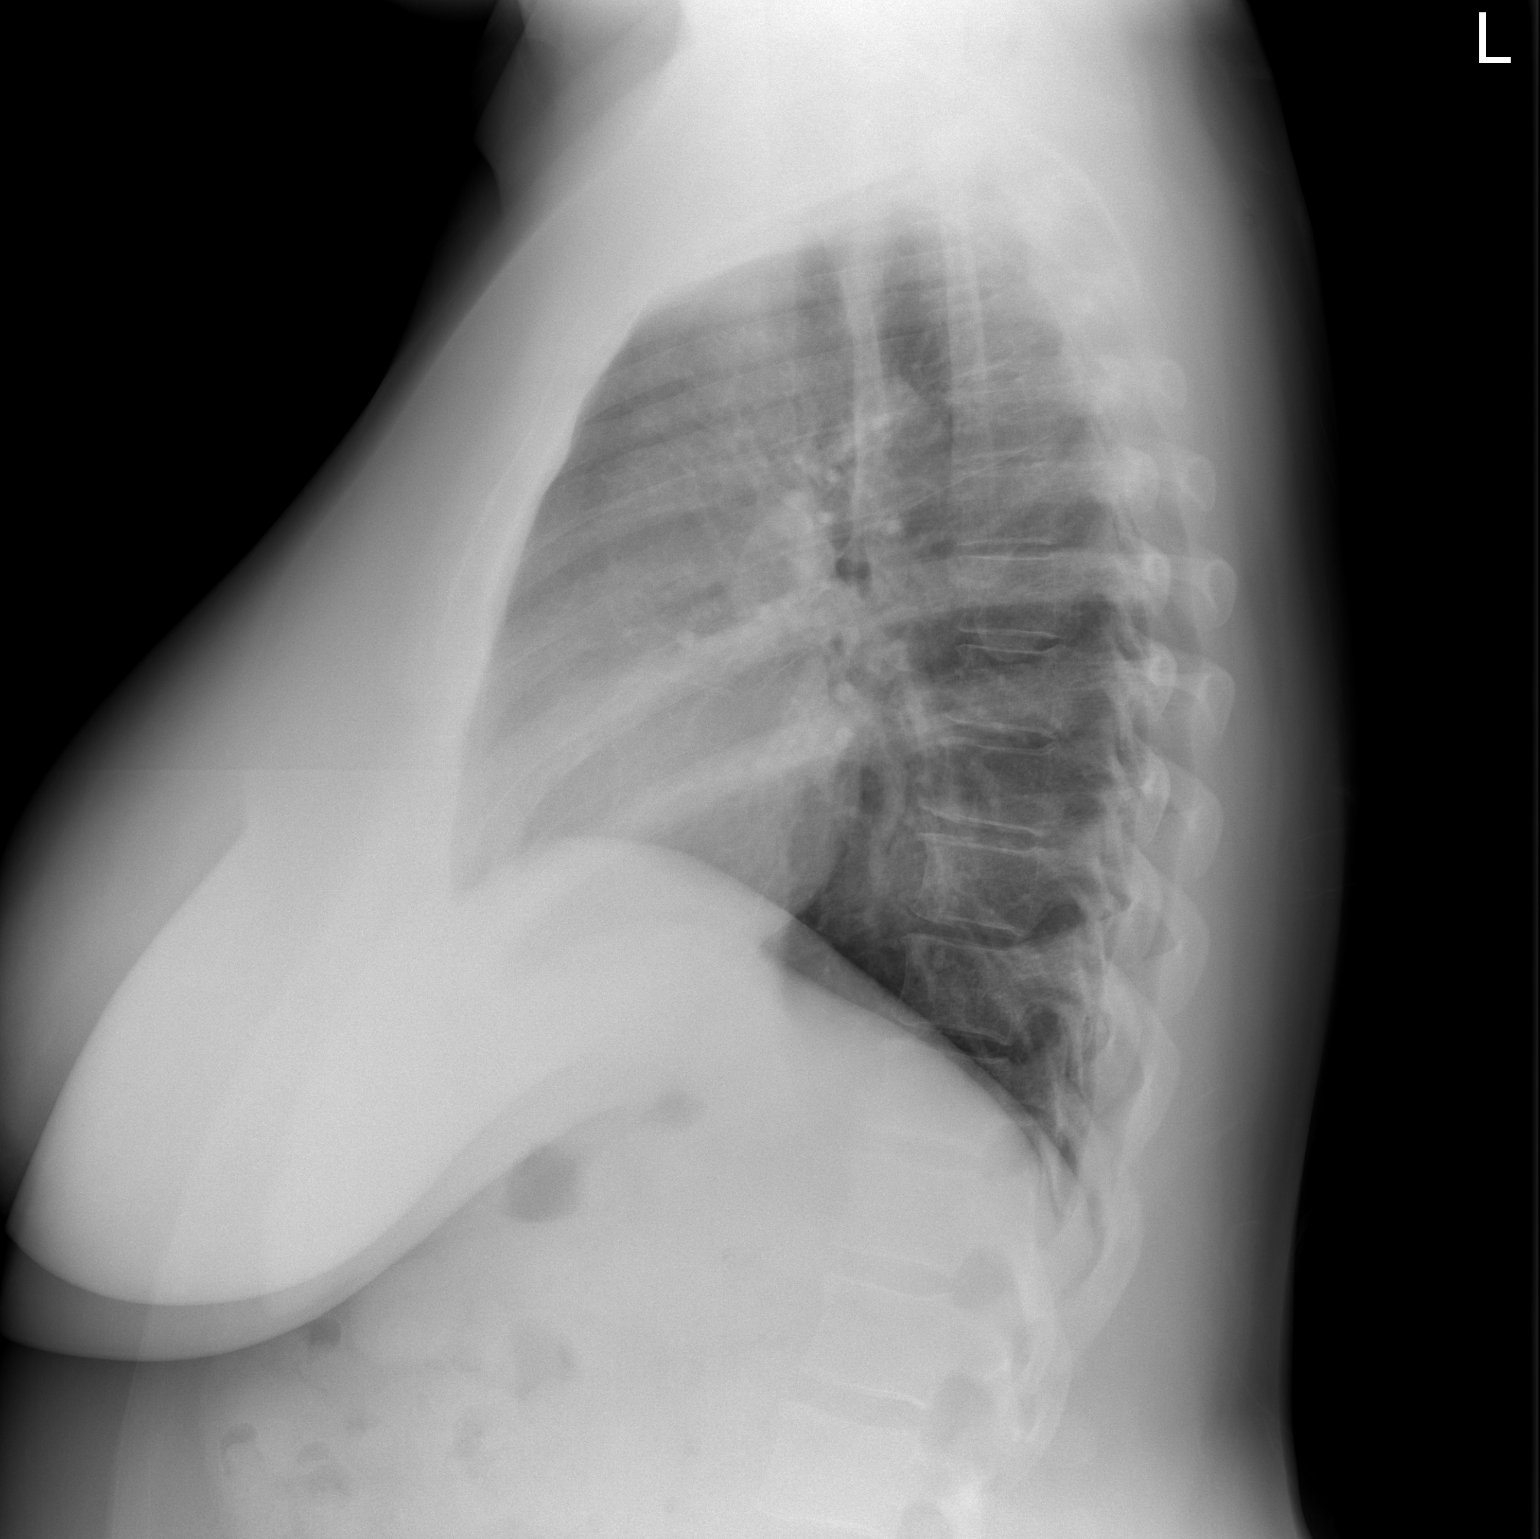

[2 of 2 positions shown; findings below may reference images not displayed]

FINDINGS: The heart size and mediastinal contours are within normal limits.
Both lungs are clear. No pneumothorax or pleural effusion is noted.
The visualized skeletal structures are unremarkable.
IMPRESSION: No active cardiopulmonary disease.

## 2016-07-16 ENCOUNTER — Other Ambulatory Visit: Payer: Self-pay

## 2016-07-16 ENCOUNTER — Encounter (HOSPITAL_COMMUNITY)
Admission: RE | Admit: 2016-07-16 | Discharge: 2016-07-16 | Disposition: A | Discharge status: Home / Self Care | Payer: BLUE CROSS/BLUE SHIELD | Source: Ambulatory Visit | Attending: Surgery | Admitting: Surgery

## 2016-07-16 DIAGNOSIS — Z01810 Encounter for preprocedural cardiovascular examination: Secondary | ICD-10-CM | POA: Diagnosis present

## 2016-08-10 ENCOUNTER — Ambulatory Visit: Payer: BLUE CROSS/BLUE SHIELD | Admitting: Registered"

## 2016-08-11 ENCOUNTER — Encounter: Payer: BLUE CROSS/BLUE SHIELD | Attending: Surgery | Admitting: Registered"

## 2016-08-11 ENCOUNTER — Encounter: Payer: Self-pay | Admitting: Registered"

## 2016-08-11 DIAGNOSIS — Z6841 Body Mass Index (BMI) 40.0 and over, adult: Secondary | ICD-10-CM | POA: Insufficient documentation

## 2016-08-11 DIAGNOSIS — Z713 Dietary counseling and surveillance: Secondary | ICD-10-CM | POA: Insufficient documentation

## 2016-08-11 DIAGNOSIS — E669 Obesity, unspecified: Secondary | ICD-10-CM

## 2016-08-11 NOTE — Progress Notes (Signed)
Pre-Op Assessment Visit:  Pre-Operative Sleeve Gastrectomy Surgery  Medical Nutrition Therapy:  Appt start time: 4:10  End time:  5:15  Patient was seen on 08/11/2016 for Pre-Operative Nutrition Assessment. Assessment and letter of approval faxed to [facility] Bariatric Surgery Program coordinator on 08/11/2016.   Pt expectation of surgery: improve knee pain and prevent having future knee surgeries  Pt expectation of Dietitian: help on what to eat and other ways to prepare foods  Start weight at [facility]: 254.6 BMI: 44.27   Pt states her meniscus and cartilage are torn in both knees. Pt states she is a picky eater and doesn't eat often. Pt states she eats green beans and corn as vegetables. Pt reports the appearance of food affects if she'll enjoy it or not. Pt states she needs new ways to cook and prepare seafood and vegetables.  Pt is unsure of how many visits are needed prior to surgery.    24 hr Dietary Recall: First Meal: 2 boiled eggs Snack: yogurt, granola bar, walnuts, [place] nuts Second Meal: chicken wings, corn on cob, green beans or steak, baked potato Snack: peanuts, Brazilian nuts, almonds, chips Third Meal: sometimes skips; beef ribs, corn on cob Snack: none Beverages: water, tea, soda (rarely)  Encouraged to engage in 150 minutes of moderate physical activity including cardiovascular and weight baring weekly  Handouts given during visit include:  . Pre-Op Goals . Bariatric Surgery Protein Shakes . Vitamin and Mineral Recommendations . Efraim Kaufmann  During the appointment today the following Pre-Op Goals were reviewed with the patient: . Maintain or lose weight as instructed by your surgeon . Make healthy food choices . Begin to limit portion sizes . Limited concentrated sugars and fried foods . Keep fat/sugar in the single digits per serving on          food labels . Practice CHEWING your food  (aim for 30 chews per bite or until applesauce  consistency) . Practice not drinking 15 minutes before, during, and 30 minutes after each meal/snack . Avoid all carbonated beverages  . Avoid/limit caffeinated beverages  . Avoid all sugar-sweetened beverages . Consume 3 meals per day; eat every 3-5 hours . Make a list of non-food related activities . Aim for 64-100 ounces of FLUID daily  . Aim for at least 60-80 grams of PROTEIN daily . Look for a liquid protein source that contain ?15 g protein and ?5 g carbohydrate  (ex: shakes, drinks, shots) . Physical activity is an important part of a healthy lifestyle so keep it moving!  Follow diet recommendations listed below Energy and Macronutrient Recommendations: Calories: 1600 Carbohydrate: 180 Protein: 120 Fat: 44  Demonstrated degree of understanding via:  Teach Back   Teaching Method Utilized:  Visual Auditory Hands on  Barriers to learning/adherence to lifestyle change: limited food choices  Patient to call the Nutrition [facility] Education Services to enroll in Pre-Op and Post-Op Nutrition Education when surgery date is scheduled.

## 2016-08-23 ENCOUNTER — Ambulatory Visit: Payer: BLUE CROSS/BLUE SHIELD | Admitting: Psychology

## 2016-08-30 ENCOUNTER — Encounter: Payer: BLUE CROSS/BLUE SHIELD | Admitting: Skilled Nursing Facility1

## 2021-04-16 ENCOUNTER — Emergency Department (HOSPITAL_COMMUNITY)
Admission: EM | Admit: 2021-04-16 | Discharge: 2021-04-16 | Disposition: A | Discharge status: Home / Self Care | Payer: BC Managed Care – PPO | Attending: Emergency Medicine | Admitting: Emergency Medicine

## 2021-04-16 ENCOUNTER — Encounter (HOSPITAL_COMMUNITY): Payer: Self-pay

## 2021-04-16 DIAGNOSIS — M542 Cervicalgia: Secondary | ICD-10-CM | POA: Diagnosis present

## 2021-04-16 DIAGNOSIS — M436 Torticollis: Secondary | ICD-10-CM | POA: Diagnosis not present

## 2021-04-16 MED ORDER — KETOROLAC TROMETHAMINE 15 MG/ML IJ SOLN
15.0000 mg | Freq: Once | INTRAMUSCULAR | Status: AC
  Administered 2021-04-16: 15 mg via INTRAMUSCULAR
  Filled 2021-04-16: qty 1

## 2021-04-16 MED ORDER — METHOCARBAMOL 500 MG PO TABS: 500.0000 mg | ORAL_TABLET | Freq: Two times a day (BID) | ORAL | 0 refills | Status: DC

## 2021-04-16 NOTE — ED Triage Notes (Signed)
Pt presents with c/o left side neck pain that started yesterday after waking up. Pt reports the pain is also in her left arm and it feels like tingling in nature.

## 2021-04-16 NOTE — ED Provider Notes (Signed)
[facility] DEPT Provider Note   CSN: [id number] Arrival date & time: 04/16/21  0840     History  Chief Complaint  Patient presents with   Neck Pain    Kaylee Nixon is a 51 y.o. female.  51 yo F with a chief complaint of left-sided neck pain.  This been going on since yesterday morning.  That she woke up and it was bothering her.  Hurts at the base of her neck and she feels like she has had some tingling that goes into her shoulder.  Continued today and so came to the ED for evaluation.  She denies trauma to the head of the neck.  Denies numbness or weakness to the arm.   Neck Pain     Home Medications Prior to Admission medications   Medication Sig Start Date End Date Taking? Authorizing Provider  methocarbamol (ROBAXIN) 500 MG tablet Take 1 tablet (500 mg total) by mouth 2 (two) times daily. 04/16/21  Yes Deno Etienne, DO  cetirizine (ZYRTEC) 10 MG tablet Take 10 mg by mouth daily.    [provider]      Allergies    Patient has no known allergies.    Review of Systems   Review of Systems  Musculoskeletal:  Positive for neck pain.   Physical Exam Updated Vital Signs BP (!) 180/107 (BP Location: Right Arm)    Pulse 89    Temp 97.9 F (36.6 C) (Oral)    Resp 18    SpO2 100%  Physical Exam Vitals and nursing note reviewed.  Constitutional:      General: She is not in acute distress.    Appearance: She is well-developed. She is not diaphoretic.     Comments: BMI 45  HENT:     Head: Normocephalic and atraumatic.  Eyes:     Pupils: Pupils are equal, round, and reactive to light.  Cardiovascular:     Rate and Rhythm: Normal rate and regular rhythm.     Heart sounds: No murmur heard.   No friction rub. No gallop.  Pulmonary:     Effort: Pulmonary effort is normal.     Breath sounds: No wheezing or rales.  Abdominal:     General: There is no distension.     Palpations: Abdomen is soft.     Tenderness: There is no abdominal  tenderness.  Musculoskeletal:        General: No tenderness.     Cervical back: Normal range of motion and neck supple.     Comments: Very mild tenderness and spasm to the proximal aspect of the trapezius on the left.  No midline C-spine tenderness step-offs or deformities.  Able to rotate her head without pain.  Negative Spurling's test.  Pulse motor and sensation intact distally.  No appreciable weakness when compared to the right.  Skin:    General: Skin is warm and dry.  Neurological:     Mental Status: She is alert and oriented to person, place, and time.  Psychiatric:        Behavior: Behavior normal.    ED Results / Procedures / Treatments   Labs (all labs ordered are listed, but only abnormal results are displayed) Labs Reviewed - No data to display  EKG None  Radiology No results found.  Procedures Procedures    Medications Ordered in ED Medications  ketorolac (TORADOL) 15 MG/ML injection 15 mg (has no administration in time range)    ED Course/ Medical Decision  Making/ A&P                           Medical Decision Making Risk Prescription drug management.   Patient is a 51 y.o. female with a cc of left-sided neck pain.  She woke up with this yesterday morning.  Most likely torticollis by history and physical.  Does not have significant pain on exam.  Full range of motion of the shoulder.  No red flags.  We will treat supportively.  Have the patient follow-up with her family doctor.  Without trauma I do not feel that imaging would be beneficial. On my record review the patient has a history of obesity and chronic knee pain.  Last seen in our system over a year ago.  9:16 AM:  I have discussed the diagnosis/risks/treatment options with the patient and friend .  Evaluation and diagnostic testing in the emergency department does not suggest an emergent condition requiring admission or immediate intervention beyond what has been performed at this time.  They will  follow up with PCP. We also discussed returning to the ED immediately if new or worsening sx occur. We discussed the sx which are most concerning (e.g., sudden worsening pain, fever, inability to tolerate by mouth, numbness, weakness) that necessitate immediate return. Medications administered to the patient during their visit and any new prescriptions provided to the patient are listed below.  Medications given during this visit Medications  ketorolac (TORADOL) 15 MG/ML injection 15 mg (has no administration in time range)     The patient appears reasonably screen and/or stabilized for discharge and I doubt any other medical condition or other [facility] requiring further screening, evaluation, or treatment in the ED at this time prior to discharge.           Final Clinical Impression(s) / ED Diagnoses Final diagnoses:  Torticollis, acute    Rx / DC Orders ED Discharge Orders          Ordered    methocarbamol (ROBAXIN) 500 MG tablet  2 times daily        04/16/21 0911              Deno Etienne, DO 04/16/21 [phone]

## 2021-04-16 NOTE — Discharge Instructions (Signed)
Take 4 over the counter ibuprofen tablets 3 times a day or 2 over-the-counter naproxen tablets twice a day for pain. Also take tylenol 1000mg (2 extra strength) four times a day.   I have prescribed you a muscle relaxant medication.  Understand that this medicine is not known to actually relax your muscles.  You can try it and see if it makes your symptoms better but you should not drive a car climbing tall heights or operate heavy machinery while taking this medication.  Please follow-up with a family doctor in about a week.  Consider changing your pillow at home.

## 2021-08-07 ENCOUNTER — Encounter (HOSPITAL_COMMUNITY): Payer: Self-pay

## 2021-08-07 ENCOUNTER — Other Ambulatory Visit: Payer: Self-pay

## 2021-08-07 ENCOUNTER — Emergency Department (HOSPITAL_COMMUNITY): Payer: BC Managed Care – PPO

## 2021-08-07 ENCOUNTER — Inpatient Hospital Stay (HOSPITAL_COMMUNITY)
Admission: EM | Admit: 2021-08-07 | Discharge: 2021-08-10 | DRG: 418 | Disposition: A | Discharge status: Home / Self Care | Payer: BC Managed Care – PPO | Attending: Surgery | Admitting: Surgery

## 2021-08-07 DIAGNOSIS — K8020 Calculus of gallbladder without cholecystitis without obstruction: Principal | ICD-10-CM | POA: Diagnosis present

## 2021-08-07 DIAGNOSIS — K8000 Calculus of gallbladder with acute cholecystitis without obstruction: Secondary | ICD-10-CM | POA: Diagnosis not present

## 2021-08-07 DIAGNOSIS — D1803 Hemangioma of intra-abdominal structures: Secondary | ICD-10-CM | POA: Diagnosis present

## 2021-08-07 DIAGNOSIS — Z6841 Body Mass Index (BMI) 40.0 and over, adult: Secondary | ICD-10-CM

## 2021-08-07 DIAGNOSIS — Z79899 Other long term (current) drug therapy: Secondary | ICD-10-CM

## 2021-08-07 LAB — COMPREHENSIVE METABOLIC PANEL
ALT: 179 U/L — ABNORMAL HIGH (ref 0–44)
AST: 396 U/L — ABNORMAL HIGH (ref 15–41)
Albumin: 3.7 g/dL (ref 3.5–5.0)
Alkaline Phosphatase: 158 U/L — ABNORMAL HIGH (ref 38–126)
Anion gap: 9 (ref 5–15)
BUN: 11 mg/dL (ref 6–20)
CO2: 27 mmol/L (ref 22–32)
Calcium: 8.7 mg/dL — ABNORMAL LOW (ref 8.9–10.3)
Chloride: 104 mmol/L (ref 98–111)
Creatinine, Ser: 0.83 mg/dL (ref 0.44–1.00)
GFR, Estimated: >60 mL/min (ref >60)
Glucose, Bld: 120 mg/dL — ABNORMAL HIGH (ref 70–99)
Potassium: 3.7 mmol/L (ref 3.5–5.1)
Sodium: 140 mmol/L (ref 135–145)
Total Bilirubin: 1.1 mg/dL (ref 0.3–1.2)
Total Protein: 7.9 g/dL (ref 6.5–8.1)

## 2021-08-07 LAB — CBC WITH DIFFERENTIAL/PLATELET
Abs Immature Granulocytes: 0.07 10*3/uL (ref 0.00–0.07)
Basophils Absolute: 0 10*3/uL (ref 0.0–0.1)
Basophils Relative: 0 %
Eosinophils Absolute: 0 10*3/uL (ref 0.0–0.5)
Eosinophils Relative: 1 %
HCT: 34.4 % — ABNORMAL LOW (ref 36.0–46.0)
Hemoglobin: 10.7 g/dL — ABNORMAL LOW (ref 12.0–15.0)
Immature Granulocytes: 1 %
Lymphocytes Relative: 10 %
Lymphs Abs: 0.9 10*3/uL (ref 0.7–4.0)
MCH: 28.4 pg (ref 26.0–34.0)
MCHC: 31.1 g/dL (ref 30.0–36.0)
MCV: 91.2 fL (ref 80.0–100.0)
Monocytes Absolute: 0.7 10*3/uL (ref 0.1–1.0)
Monocytes Relative: 9 %
Neutro Abs: 6.8 10*3/uL (ref 1.7–7.7)
Neutrophils Relative %: 79 %
Platelets: 387 10*3/uL (ref 150–400)
RBC: 3.77 MIL/uL — ABNORMAL LOW (ref 3.87–5.11)
RDW: 17.1 % — ABNORMAL HIGH (ref 11.5–15.5)
WBC: 8.5 10*3/uL (ref 4.0–10.5)
nRBC: 0 % (ref 0.0–0.2)

## 2021-08-07 LAB — URINALYSIS, ROUTINE W REFLEX MICROSCOPIC
Bacteria, UA: NONE SEEN
Bilirubin Urine: NEGATIVE
Glucose, UA: NEGATIVE mg/dL
Ketones, ur: NEGATIVE mg/dL
Leukocytes,Ua: NEGATIVE
Nitrite: NEGATIVE
Protein, ur: NEGATIVE mg/dL
Specific Gravity, Urine: 1.009 (ref 1.005–1.030)
pH: 7 (ref 5.0–8.0)

## 2021-08-07 LAB — LIPASE, BLOOD: Lipase: 39 U/L (ref 11–51)

## 2021-08-07 IMAGING — US US ABDOMEN LIMITED
1 series · 15 of 25 positions shown · non-contrast
Comparison: None Available.

CLINICAL DATA: Epigastric pain for 3 weeks

EXAM:
ULTRASOUND ABDOMEN LIMITED RIGHT UPPER QUADRANT

[Series 1: us abdomen limited ruq mc & wl · 15 of 47 slices shown]
[im 1/47]
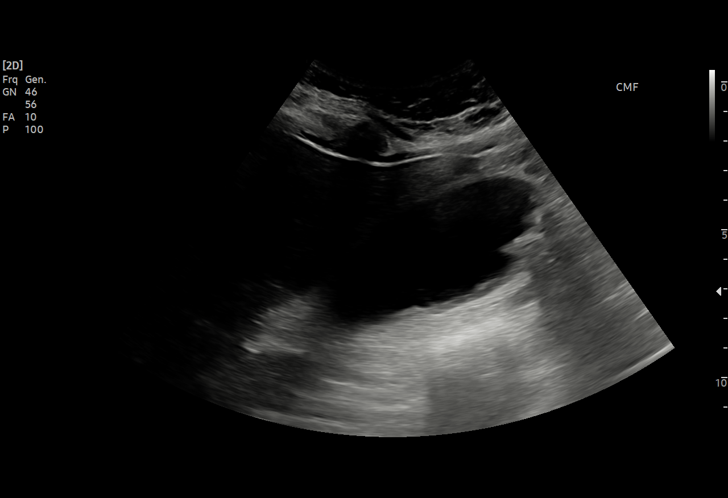
[im 4/47]
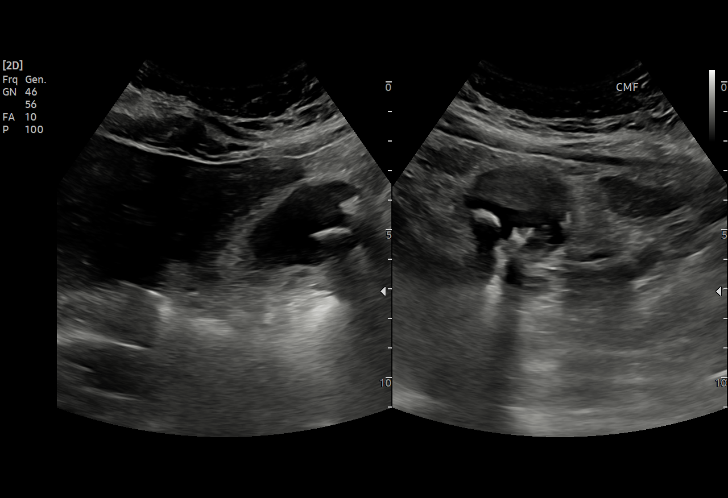
[im 8/47]
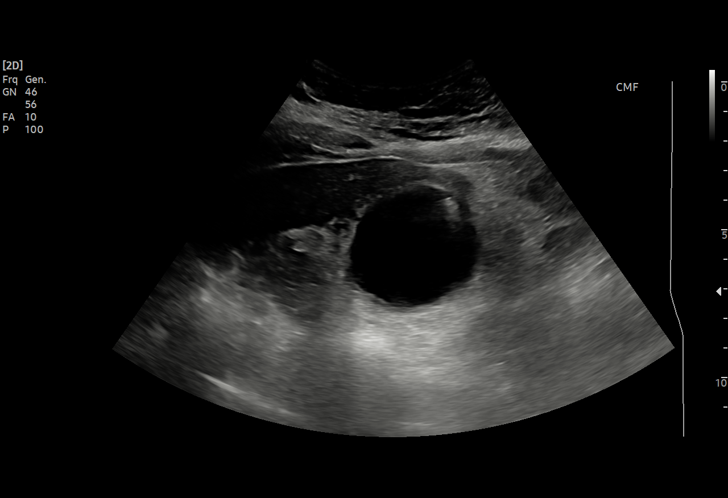
[im 10/47]
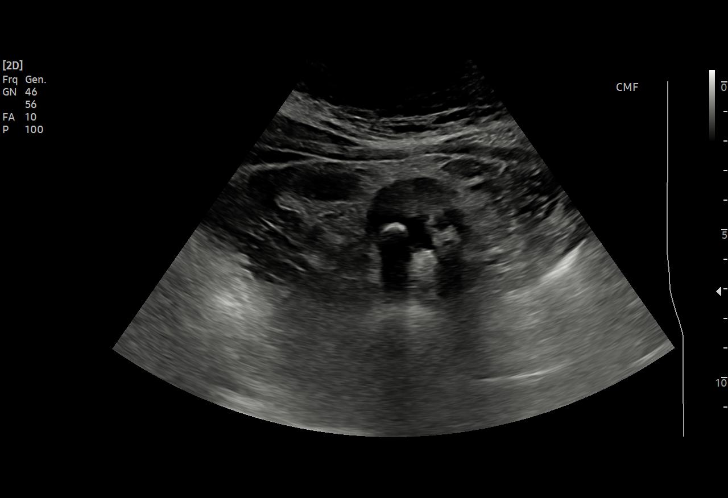
[im 14/47]
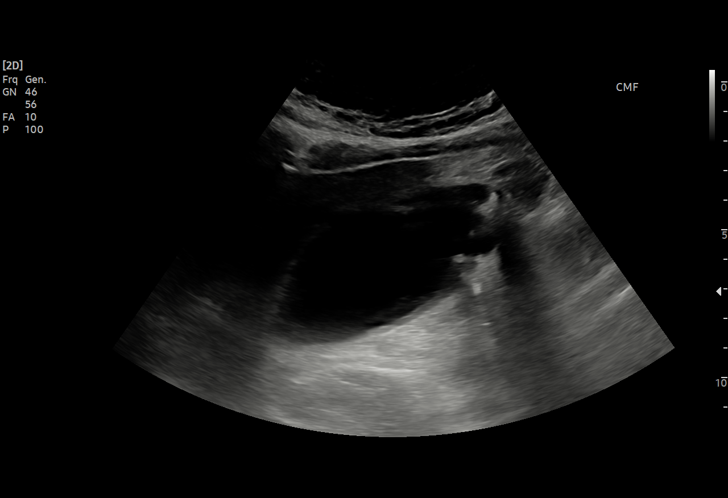
[im 18/47]
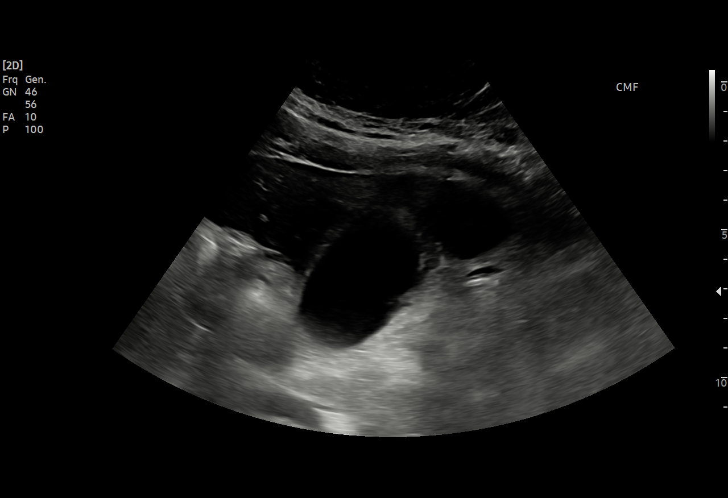
[im 20/47]
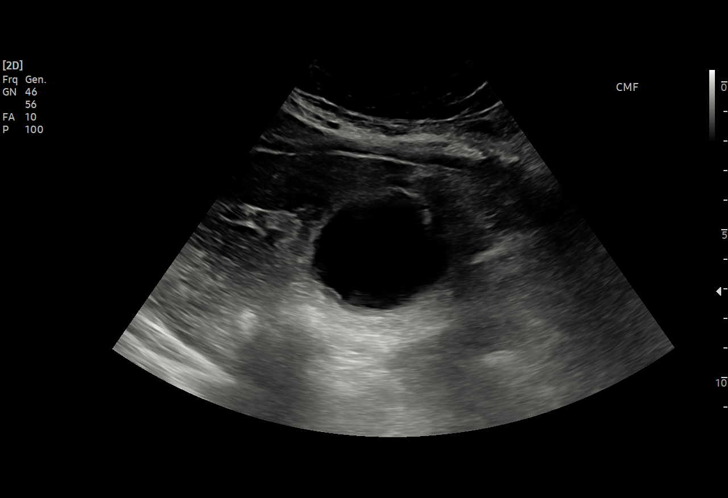
[im 24/47]
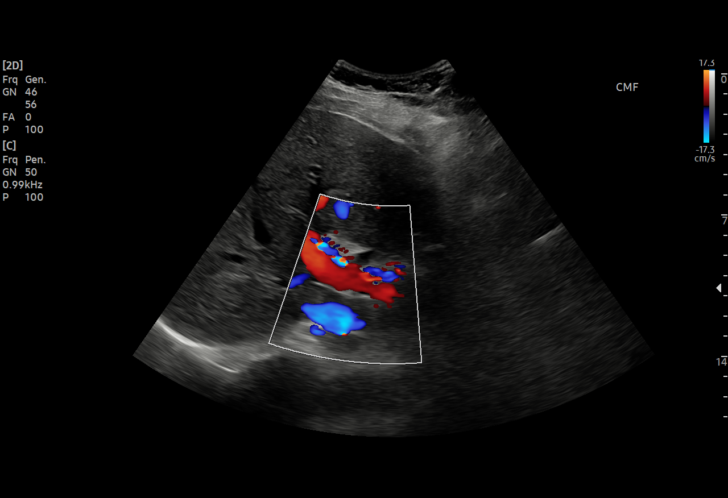
[im 27/47]
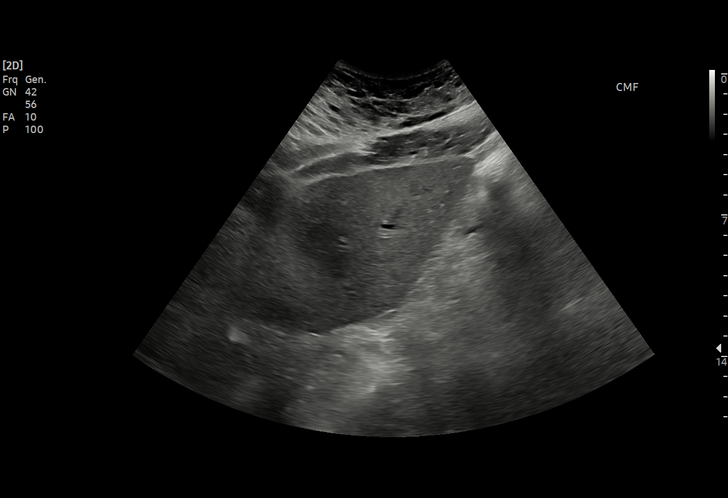
[im 29/47]
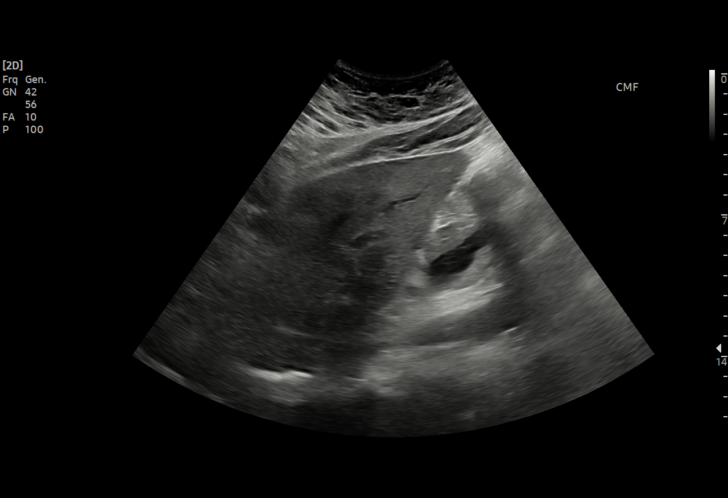
[im 33/47]
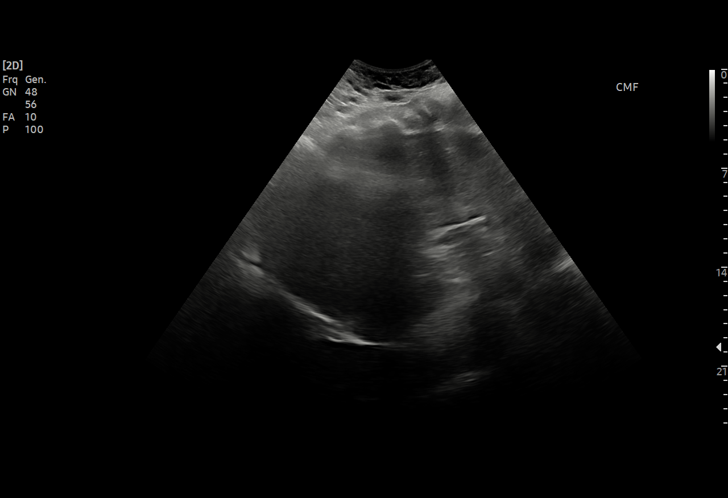
[im 37/47]
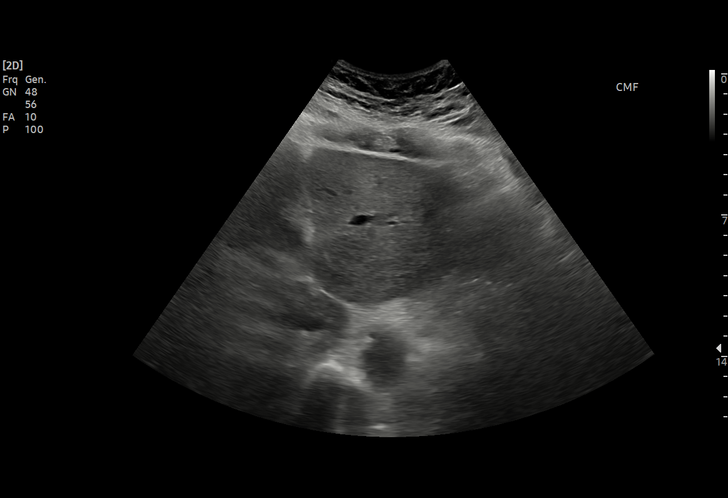
[im 39/47]
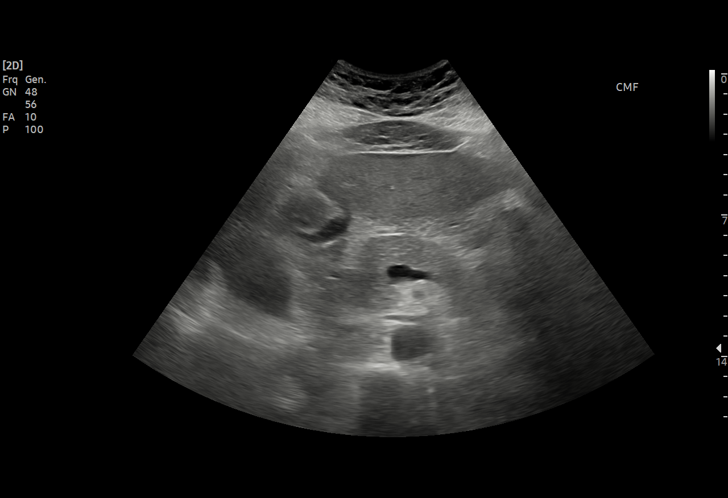
[im 43/47]
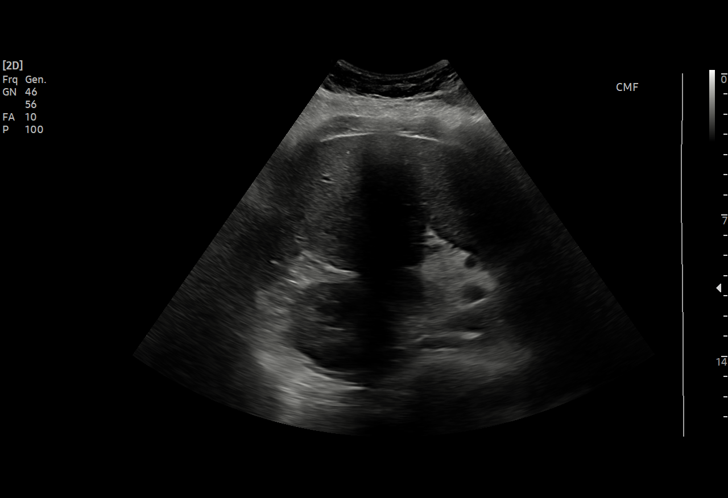
[im 47/47]
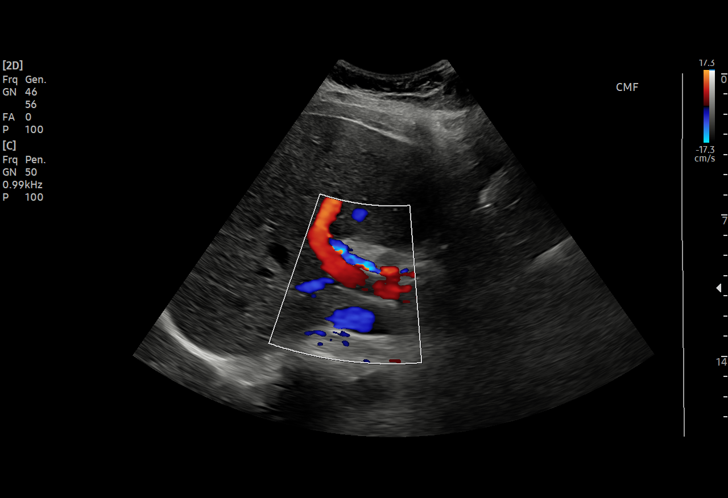

[15 of 25 positions shown; findings below may reference images not displayed]

FINDINGS: Gallbladder:

Gallstones: Numerous gallstones measuring up to 1.4 cm.

Sludge: Present

Gallbladder Wall: Mild gallbladder wall thickening measuring up to 4
mm.

Pericholecystic fluid: None

Sonographic Murphy's Sign: Negative per technologist

Common bile duct:

Diameter: 6-7 mm

Liver:

Parenchymal echogenicity: Within normal limits

Contours: Normal

Lesions: None

Portal vein: Patent.  Hepatopetal flow

Other: None.
IMPRESSION: Moderate cholelithiasis with mild gallbladder wall thickening
suspicious for cholecystitis. HIDA scan would be beneficial if
clinically appropriate.

## 2021-08-07 MED ORDER — ONDANSETRON HCL 4 MG/2ML IJ SOLN
4.0000 mg | Freq: Four times a day (QID) | INTRAMUSCULAR | Status: DC | PRN
  Administered 2021-08-07 – 2021-08-10 (×5): 4 mg via INTRAVENOUS
  Filled 2021-08-07 (×5): qty 2

## 2021-08-07 MED ORDER — LACTATED RINGERS IV SOLN: INTRAVENOUS | Status: DC

## 2021-08-07 MED ORDER — DIPHENHYDRAMINE HCL 50 MG/ML IJ SOLN: 25.0000 mg | Freq: Four times a day (QID) | INTRAMUSCULAR | Status: DC | PRN

## 2021-08-07 MED ORDER — ONDANSETRON HCL 4 MG/2ML IJ SOLN
4.0000 mg | Freq: Once | INTRAMUSCULAR | Status: AC
  Administered 2021-08-07: 4 mg via INTRAVENOUS
  Filled 2021-08-07: qty 2

## 2021-08-07 MED ORDER — ENOXAPARIN SODIUM 40 MG/0.4ML IJ SOSY: 40.0000 mg | PREFILLED_SYRINGE | INTRAMUSCULAR | Status: DC

## 2021-08-07 MED ORDER — HYDRALAZINE HCL 20 MG/ML IJ SOLN
10.0000 mg | INTRAMUSCULAR | Status: DC | PRN
  Administered 2021-08-07: 10 mg via INTRAVENOUS
  Filled 2021-08-07: qty 1

## 2021-08-07 MED ORDER — ONDANSETRON 4 MG PO TBDP: 4.0000 mg | ORAL_TABLET | Freq: Four times a day (QID) | ORAL | Status: DC | PRN

## 2021-08-07 MED ORDER — KETOROLAC TROMETHAMINE 15 MG/ML IJ SOLN
15.0000 mg | Freq: Four times a day (QID) | INTRAMUSCULAR | Status: DC | PRN
  Administered 2021-08-07 – 2021-08-10 (×4): 15 mg via INTRAVENOUS
  Filled 2021-08-07 (×4): qty 1

## 2021-08-07 MED ORDER — HYDROMORPHONE HCL 1 MG/ML IJ SOLN
0.5000 mg | INTRAMUSCULAR | Status: DC | PRN
  Administered 2021-08-07 – 2021-08-08 (×7): 0.5 mg via INTRAVENOUS
  Filled 2021-08-07 (×4): qty 0.5
  Filled 2021-08-07: qty 1
  Filled 2021-08-07 (×2): qty 0.5

## 2021-08-07 MED ORDER — SODIUM CHLORIDE 0.9 % IV BOLUS
500.0000 mL | Freq: Once | INTRAVENOUS | Status: AC
  Administered 2021-08-07: 500 mL via INTRAVENOUS

## 2021-08-07 MED ORDER — MORPHINE SULFATE (PF) 4 MG/ML IV SOLN
4.0000 mg | Freq: Once | INTRAVENOUS | Status: AC
  Administered 2021-08-07: 4 mg via INTRAVENOUS
  Filled 2021-08-07: qty 1

## 2021-08-07 MED ORDER — DIPHENHYDRAMINE HCL 25 MG PO CAPS: 25.0000 mg | ORAL_CAPSULE | Freq: Four times a day (QID) | ORAL | Status: DC | PRN

## 2021-08-07 NOTE — ED Triage Notes (Signed)
Pt BIB [facility] c/o diffuse abdominal pain x 2 weeks. N/V on and off. Denies urinary symptoms

## 2021-08-07 NOTE — H&P (Signed)
Torunn Gaul 03/21/1970  [id number].    Requesting MD: Dr. Eulis Foster Chief Complaint/Reason for Consult: Gallstones, abdominal pain  HPI:  Ms. Podoll is a 51 yo female who presented to the ED this evening with epigastric and RUQ abdominal pain. The pain began about 3 weeks ago, but has become increasingly severe. She was able to eat breakfast this morning but afterward developed nausea and vomiting. Prior to the pain beginning 3 weeks ago, she has never previously had similar episodes of pain. Labs in the ED are significant for elevated transaminases (AST/ALT 396/179). Tbili is normal at 1.1 and alk phos is mildly elevated at 158. A RUQ US showed gallstones without signs of acute cholecystitis or biliary ductal dilation. She continues to have pain and nausea at the time of my exam.  Prior abdominal surgeries include a C section and uterine ablation.  ROS: Review of Systems  Constitutional:  Positive for chills. Negative for fever.  Respiratory:  Negative for shortness of breath and wheezing.   Cardiovascular:  Negative for chest pain.  Gastrointestinal:  Positive for abdominal pain, nausea and vomiting.  Musculoskeletal:  Negative for back pain.  Neurological:  Negative for seizures and weakness.   Family History  Problem Relation Age of Onset   Diabetes Mother    Diabetes Father    Heart disease Sister    Diabetes Sister    Diabetes Brother    Diabetes Brother    Hypertension Other     Past Medical History:  Diagnosis Date   Medical history non-contributory    No pertinent past medical history     Past Surgical History:  Procedure Laterality Date   CESAREAN SECTION     DILATION AND CURETTAGE OF UTERUS     TUBAL LIGATION     uterine oblation      Social History:  reports that she has never smoked. She does not have any smokeless tobacco history on file. She reports current alcohol use. She reports that she does not use drugs.  Allergies: No Known Allergies  (Not in a  hospital admission)    Physical Exam: Blood pressure (!) 188/112, pulse 93, temperature 98.5 F (36.9 C), temperature source Oral, resp. rate 12, height '5\' 4"'  (1.626 m), weight 112.9 kg, SpO2 100 %. General: resting in bed, appears uncomfortable Neurological: alert and oriented, no focal deficits, cranial nerves grossly in tact HEENT: normocephalic, atraumatic, no scleral icterus CV: regular rate and rhythm, extremities warm and well-perfused Respiratory: normal work of breathing on room air, symmetric chest wall expansion Abdomen: soft, nondistended, focally tender to palpation in the RUQ and epigastric area. No masses or organomegaly. Well-healed infraumbilical surgical scar. Extremities: warm and well-perfused, no deformities, moving all extremities spontaneously Psychiatric: normal mood and affect Skin: warm and dry, no jaundice, no rashes or lesions   Results for orders placed or performed during the hospital encounter of 08/07/21 (from the past 48 hour(s))  CBC with Differential     Status: Abnormal   Collection Time: 08/07/21  4:18 PM  Result Value Ref Range   WBC 8.5 4.0 - 10.5 K/uL   RBC 3.77 (L) 3.87 - 5.11 MIL/uL   Hemoglobin 10.7 (L) 12.0 - 15.0 g/dL   HCT 34.4 (L) 36.0 - 46.0 %   MCV 91.2 80.0 - 100.0 fL   MCH 28.4 26.0 - 34.0 pg   MCHC 31.1 30.0 - 36.0 g/dL   RDW 17.1 (H) 11.5 - 15.5 %   Platelets 387 150 - 400 K/uL  nRBC 0.0 0.0 - 0.2 %   Neutrophils Relative % 79 %   Neutro Abs 6.8 1.7 - 7.7 K/uL   Lymphocytes Relative 10 %   Lymphs Abs 0.9 0.7 - 4.0 K/uL   Monocytes Relative 9 %   Monocytes Absolute 0.7 0.1 - 1.0 K/uL   Eosinophils Relative 1 %   Eosinophils Absolute 0.0 0.0 - 0.5 K/uL   Basophils Relative 0 %   Basophils Absolute 0.0 0.0 - 0.1 K/uL   Immature Granulocytes 1 %   Abs Immature Granulocytes 0.07 0.00 - 0.07 K/uL    Comment: Performed at [facility], [place] [address] [place], [place] [address]  Comprehensive metabolic  panel     Status: Abnormal   Collection Time: 08/07/21  4:18 PM  Result Value Ref Range   Sodium 140 135 - 145 mmol/L   Potassium 3.7 3.5 - 5.1 mmol/L   Chloride 104 98 - 111 mmol/L   CO2 27 22 - 32 mmol/L   Glucose, Bld 120 (H) 70 - 99 mg/dL    Comment: Glucose reference range applies only to samples taken after fasting for at least 8 hours.   BUN 11 6 - 20 mg/dL   Creatinine, Ser 0.83 0.44 - 1.00 mg/dL   Calcium 8.7 (L) 8.9 - 10.3 mg/dL   Total Protein 7.9 6.5 - 8.1 g/dL   Albumin 3.7 3.5 - 5.0 g/dL   AST 396 (H) 15 - 41 U/L   ALT 179 (H) 0 - 44 U/L   Alkaline Phosphatase 158 (H) 38 - 126 U/L   Total Bilirubin 1.1 0.3 - 1.2 mg/dL   GFR, Estimated >60 >60 mL/min    Comment: (NOTE) Calculated using the CKD-EPI Creatinine Equation (2021)    Anion gap 9 5 - 15    Comment: Performed at [facility], [place] [address], [place], [place] [address]  Lipase, blood     Status: None   Collection Time: 08/07/21  4:18 PM  Result Value Ref Range   Lipase 39 11 - 51 U/L    Comment: Performed at [facility], [place] [address] [place], [place] [address]  Urinalysis, Routine w reflex microscopic     Status: Abnormal   Collection Time: 08/07/21  4:18 PM  Result Value Ref Range   Color, Urine YELLOW YELLOW   APPearance CLEAR CLEAR   Specific Gravity, Urine 1.009 1.005 - 1.030   pH 7.0 5.0 - 8.0   Glucose, UA NEGATIVE NEGATIVE mg/dL   Hgb urine dipstick SMALL (A) NEGATIVE   Bilirubin Urine NEGATIVE NEGATIVE   Ketones, ur NEGATIVE NEGATIVE mg/dL   Protein, ur NEGATIVE NEGATIVE mg/dL   Nitrite NEGATIVE NEGATIVE   Leukocytes,Ua NEGATIVE NEGATIVE   RBC / HPF 0-5 0 - 5 RBC/hpf   WBC, UA 0-5 0 - 5 WBC/hpf   Bacteria, UA NONE SEEN NONE SEEN   Squamous Epithelial / LPF 0-5 0 - 5   Mucus PRESENT     Comment: Performed at [facility], [place] [address], [place], [place] [address]   US Abdomen Limited RUQ (LIVER/GB)  Result Date:  08/07/2021 CLINICAL DATA:  Epigastric pain for 3 weeks EXAM: ULTRASOUND ABDOMEN LIMITED RIGHT UPPER QUADRANT COMPARISON:  None Available. FINDINGS: Gallbladder: Gallstones: Numerous gallstones measuring up to 1.4 cm. Sludge: Present Gallbladder Wall: Mild gallbladder wall thickening measuring up to 4 mm. Pericholecystic fluid: None Sonographic Murphy's Sign: Negative per technologist Common bile duct: Diameter: 6-7 mm Liver: Parenchymal echogenicity:  Within normal limits Contours: Normal Lesions: None Portal vein: Patent.  Hepatopetal flow Other: None. IMPRESSION: Moderate cholelithiasis with mild gallbladder wall thickening suspicious for cholecystitis. HIDA scan would be beneficial if clinically appropriate. Electronically Signed   By: Miachel Roux M.D.   On: 08/07/2021 16:57      Assessment/Plan 51 yo female presenting with worsening RUQ/epigastric pain, elevated transaminases and cholelithiasis. Symptoms are consistent with symptomatic cholelithiasis. I personally reviewed her [place]. Given elevated LFTs, patient may also have recently passed a stone via the common bile duct. Lipase is normal. - NPO except sips/chips. IV fluid hydration. - Pain and nausea control - Repeat LFTs tomorrow am. If significantly trending up, will need MRCP to evaluate for choledocholithiasis. If not uptrending, plan for lap chole with intraop cholangiogram. Keep NPO at midnight for possible surgery tomorrow. - VTE: lovenox, SCDs - Dispo: admit, med-surg floor.  Level of medical decision-making: moderate  Michaelle Birks, MD [facility], Hepatobiliary and Pancreatic Surgery 08/07/21 7:30 PM

## 2021-08-07 NOTE — ED Provider Notes (Signed)
5:05 PM-checkout from Dr. Dina Rich to evaluate patient after return of laboratory testing to complete evaluation for upper abdominal pain.  She has been having pain with vomiting for couple weeks.  No prior similar problems.  Evaluation in the ED so far has indicated gallstones with possible cholecystitis however white count and temperature are normal.  6:25 PM-lipase is normal, metabolic panel normal except glucose high, calcium low, AST high, ALT high, alk phos stays high.  Patient has been treated with single dose of morphine and Zofran, and IV fluid bolus.  At this time, patient is uncomfortable complaining of upper abdominal pain.  Findings discussed with her.  She is agreeable to hospitalization.  6:45 PM-discussed with general surgery Dr. Zenia Resides who will see the patient.  She does not think the patient needs MRCP at this point.   Daleen Bo, MD 08/07/21 [phone]

## 2021-08-07 NOTE — ED Provider Notes (Signed)
[facility] DEPT Provider Note   CSN: [id number] Arrival date & time: 08/07/21  1346     History  Chief Complaint  Patient presents with   Abdominal Pain    Marcie Shearon is a 51 y.o. female.  HPI  51 year old female presents to the emergency department with concern for upper abdominal pain.  Patient states has been going on intermittently for the past couple weeks, became more severe today.  Is associated with nausea/vomiting.  Feels as if the pain radiates to her right upper quadrant and right upper back.  No history of gallbladder pathology in the past.  Denies any fever.  States that her bowel movements have been "irregular" but denies any diarrhea.  No fever or genitourinary symptoms.  Home Medications Prior to Admission medications   Medication Sig Start Date End Date Taking? Authorizing Provider  cetirizine (ZYRTEC) 10 MG tablet Take 10 mg by mouth daily.    [provider]  methocarbamol (ROBAXIN) 500 MG tablet Take 1 tablet (500 mg total) by mouth 2 (two) times daily. 04/16/21   Deno Etienne, DO      Allergies    Patient has no known allergies.    Review of Systems   Review of Systems  Constitutional:  Positive for appetite change. Negative for fever.  Respiratory:  Negative for shortness of breath.   Cardiovascular:  Negative for chest pain.  Gastrointestinal:  Positive for abdominal pain, nausea and vomiting. Negative for diarrhea.  Musculoskeletal:  Positive for back pain.  Skin:  Negative for rash.  Neurological:  Negative for headaches.   Physical Exam Updated Vital Signs BP (!) 177/100 (BP Location: Right Arm)   Pulse 94   Temp 98.5 F (36.9 C) (Oral)   Resp 15   Ht '5\' 4"'$  (1.626 m)   Wt 112.9 kg   SpO2 100%   BMI 42.74 kg/m  Physical Exam Vitals and nursing note reviewed.  Constitutional:      General: She is not in acute distress.    Appearance: Normal appearance.  HENT:     Head: Normocephalic.      Mouth/Throat:     Mouth: Mucous membranes are moist.  Cardiovascular:     Rate and Rhythm: Normal rate.  Pulmonary:     Effort: Pulmonary effort is normal. No respiratory distress.  Abdominal:     Palpations: Abdomen is soft.     Tenderness: There is abdominal tenderness in the right upper quadrant. There is guarding. There is no rebound.  Skin:    General: Skin is warm.  Neurological:     Mental Status: She is alert and oriented to person, place, and time. Mental status is at baseline.  Psychiatric:        Mood and Affect: Mood normal.    ED Results / Procedures / Treatments   Labs (all labs ordered are listed, but only abnormal results are displayed) Labs Reviewed  CBC WITH DIFFERENTIAL/PLATELET  COMPREHENSIVE METABOLIC PANEL  LIPASE, BLOOD  URINALYSIS, ROUTINE W REFLEX MICROSCOPIC    EKG None  Radiology No results found.  Procedures Procedures    Medications Ordered in ED Medications  sodium chloride 0.9 % bolus 500 mL (has no administration in time range)  ondansetron (ZOFRAN) injection 4 mg (has no administration in time range)  morphine (PF) 4 MG/ML injection 4 mg (has no administration in time range)    ED Course/ Medical Decision Making/ A&P  Medical Decision Making Amount and/or Complexity of Data Reviewed Labs: ordered. Radiology: ordered.  Risk Prescription drug management.   51 year old female presents to the emergency department with abdominal pain that wraps around to her right upper back, nausea/vomiting.  Vitals are stable on arrival, she is tender in the abdomen specifically the right upper quadrant.  Initial blood work shows no leukocytosis, urinalysis is unremarkable.  We are pending CMP/lipase.  Ultrasound of the right upper quadrant ordered.  Patient given symptomatic treatment.        Final Clinical Impression(s) / ED Diagnoses Final diagnoses:  None    Rx / DC Orders ED Discharge Orders     None          Lorelle Gibbs, DO 08/07/21 1653

## 2021-08-08 ENCOUNTER — Observation Stay (HOSPITAL_COMMUNITY): Payer: BC Managed Care – PPO

## 2021-08-08 DIAGNOSIS — Z6841 Body Mass Index (BMI) 40.0 and over, adult: Secondary | ICD-10-CM | POA: Diagnosis not present

## 2021-08-08 DIAGNOSIS — K8000 Calculus of gallbladder with acute cholecystitis without obstruction: Secondary | ICD-10-CM | POA: Diagnosis present

## 2021-08-08 DIAGNOSIS — Z79899 Other long term (current) drug therapy: Secondary | ICD-10-CM | POA: Diagnosis not present

## 2021-08-08 DIAGNOSIS — D1803 Hemangioma of intra-abdominal structures: Secondary | ICD-10-CM | POA: Diagnosis present

## 2021-08-08 LAB — COMPREHENSIVE METABOLIC PANEL
ALT: 235 U/L — ABNORMAL HIGH (ref 0–44)
ALT: 203 U/L — ABNORMAL HIGH (ref 0–44)
AST: 260 U/L — ABNORMAL HIGH (ref 15–41)
AST: 155 U/L — ABNORMAL HIGH (ref 15–41)
Albumin: 3.4 g/dL — ABNORMAL LOW (ref 3.5–5.0)
Albumin: 3.3 g/dL — ABNORMAL LOW (ref 3.5–5.0)
Alkaline Phosphatase: 160 U/L — ABNORMAL HIGH (ref 38–126)
Alkaline Phosphatase: 151 U/L — ABNORMAL HIGH (ref 38–126)
Anion gap: 7 (ref 5–15)
Anion gap: 8 (ref 5–15)
BUN: 8 mg/dL (ref 6–20)
BUN: 6 mg/dL (ref 6–20)
CO2: 25 mmol/L (ref 22–32)
CO2: 21 mmol/L — ABNORMAL LOW (ref 22–32)
Calcium: 8.8 mg/dL — ABNORMAL LOW (ref 8.9–10.3)
Calcium: 8.6 mg/dL — ABNORMAL LOW (ref 8.9–10.3)
Chloride: 107 mmol/L (ref 98–111)
Chloride: 108 mmol/L (ref 98–111)
Creatinine, Ser: 0.63 mg/dL (ref 0.44–1.00)
Creatinine, Ser: 0.61 mg/dL (ref 0.44–1.00)
GFR, Estimated: >60 mL/min (ref >60)
GFR, Estimated: >60 mL/min (ref >60)
Glucose, Bld: 115 mg/dL — ABNORMAL HIGH (ref 70–99)
Glucose, Bld: 120 mg/dL — ABNORMAL HIGH (ref 70–99)
Potassium: 3.6 mmol/L (ref 3.5–5.1)
Potassium: 4 mmol/L (ref 3.5–5.1)
Sodium: 139 mmol/L (ref 135–145)
Sodium: 137 mmol/L (ref 135–145)
Total Bilirubin: 2.6 mg/dL — ABNORMAL HIGH (ref 0.3–1.2)
Total Bilirubin: 4.1 mg/dL — ABNORMAL HIGH (ref 0.3–1.2)
Total Protein: 7.5 g/dL (ref 6.5–8.1)
Total Protein: 7.4 g/dL (ref 6.5–8.1)

## 2021-08-08 LAB — CBC
HCT: 33.5 % — ABNORMAL LOW (ref 36.0–46.0)
Hemoglobin: 10.5 g/dL — ABNORMAL LOW (ref 12.0–15.0)
MCH: 27.9 pg (ref 26.0–34.0)
MCHC: 31.3 g/dL (ref 30.0–36.0)
MCV: 89.1 fL (ref 80.0–100.0)
Platelets: 391 10*3/uL (ref 150–400)
RBC: 3.76 MIL/uL — ABNORMAL LOW (ref 3.87–5.11)
RDW: 17.1 % — ABNORMAL HIGH (ref 11.5–15.5)
WBC: 6.6 10*3/uL (ref 4.0–10.5)
nRBC: 0 % (ref 0.0–0.2)

## 2021-08-08 LAB — HIV ANTIBODY (ROUTINE TESTING W REFLEX): HIV Screen 4th Generation wRfx: NONREACTIVE

## 2021-08-08 LAB — SURGICAL PCR SCREEN
MRSA, PCR: NEGATIVE
Staphylococcus aureus: NEGATIVE

## 2021-08-08 IMAGING — MR MR ABDOMEN WO/W CM MRCP
18 of 20 series · 43 of 48 positions shown · IV contrast (10 ML GADAVIST)
Comparison: None Available.

CLINICAL DATA: Diffuse abdominal pain and nausea and vomiting for 2
weeks. Cholelithiasis.

EXAM:
MRI ABDOMEN WITHOUT AND WITH CONTRAST (INCLUDING MRCP)
TECHNIQUE: Multiplanar multisequence MR imaging of the abdomen was performed
both before and after the administration of intravenous contrast.
Heavily T2-weighted images of the biliary and pancreatic ducts were
obtained, and three-dimensional MRCP images were rendered by post
processing.
CONTRAST:  10mL GADAVIST GADOBUTROL 1 MMOL/ML IV SOLN

[Series 2: T2 fat-sat · axial · 6.0mm · 1.34mm/px · z∈[-82,+206]mm · 2 of 41 slices shown]
[im 1/41]
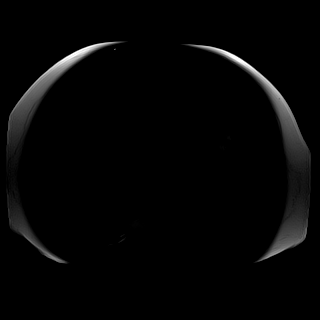
[im 41/41]
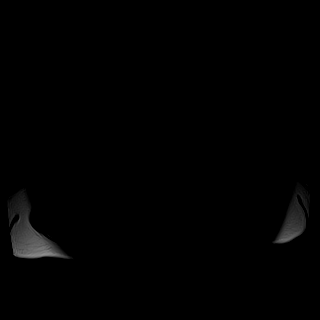

[Series 4: DWI · axial · 6.0mm · 1.60mm/px · z∈[-73,+215]mm · 3 of 82 slices shown (1 of 2)]
[im 1/82]
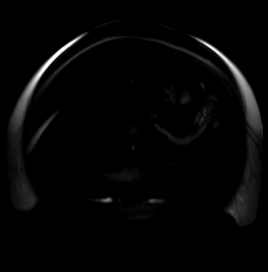
[im 41/82]
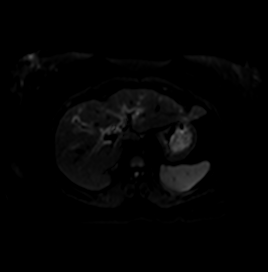
[im 82/82]
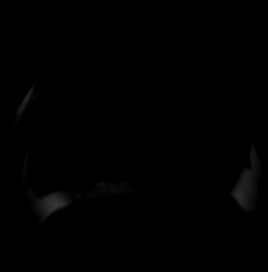

[Series 5: DWI · axial · 6.0mm · 1.60mm/px · 1 of 41 slices shown (2 of 2)]
[im 1/41]
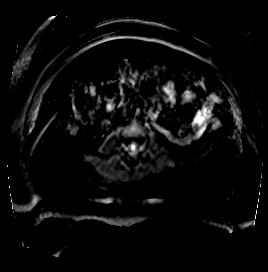

[Series 7: cor_3d_spc_trig · coronal · 1.0mm · 0.49mm/px · 2 of 70 slices shown]
[im 1/70]
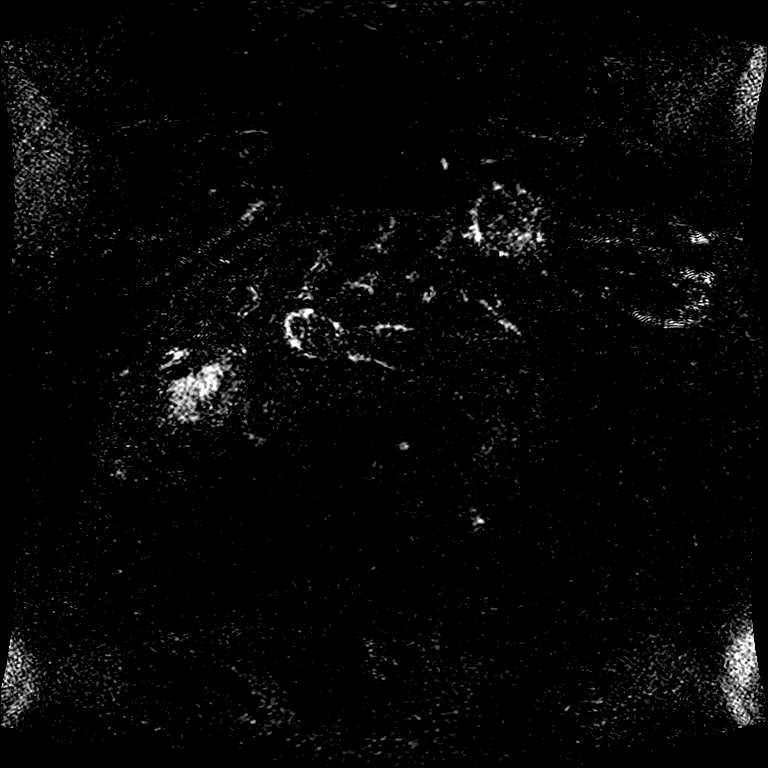
[im 70/70]
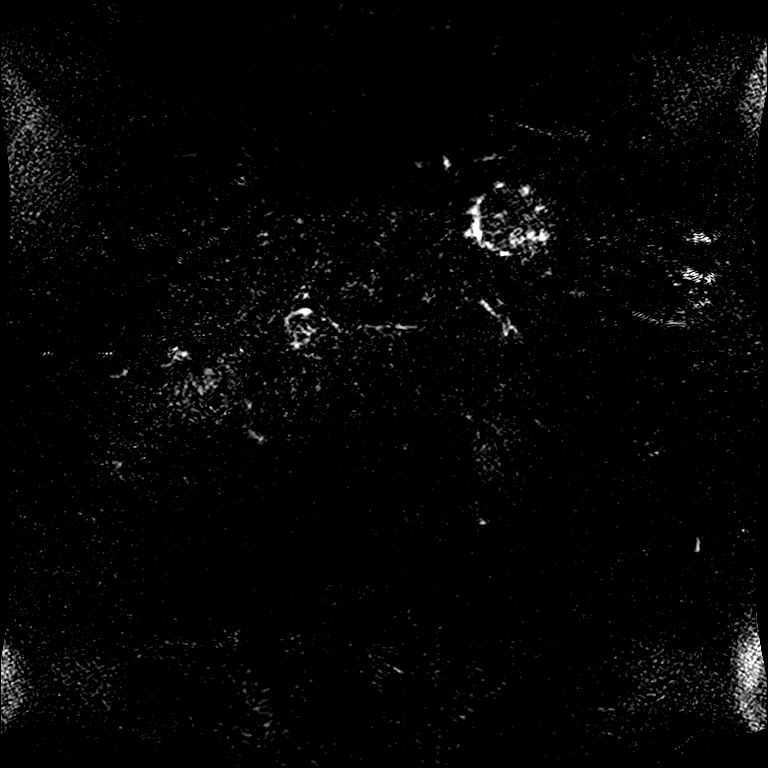

[Series 11: T2 · coronal · 6.0mm · 1.48mm/px · 1 of 43 slices shown (1 of 2)]
[im 1/43]
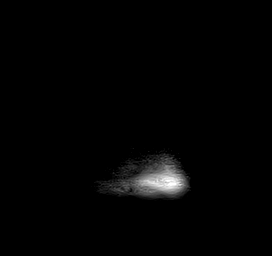

[Series 12: T1 · axial · 3.5mm · 1.34mm/px · z∈[-90,+214]mm · 3 of 88 slices shown (1 of 2)]
[im 1/88]
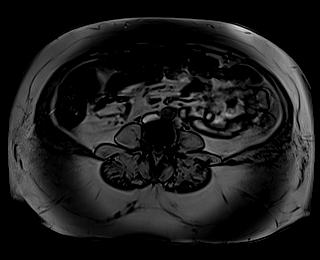
[im 44/88]
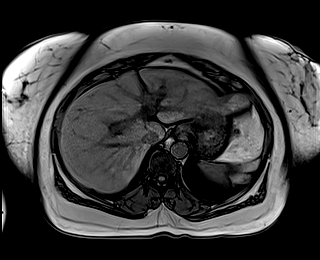
[im 88/88]
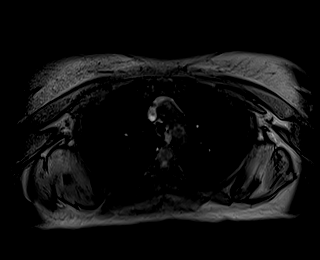

[Series 13: T1 · axial · 3.5mm · 1.34mm/px · z∈[-90,+214]mm · 3 of 88 slices shown (2 of 2)]
[im 1/88]
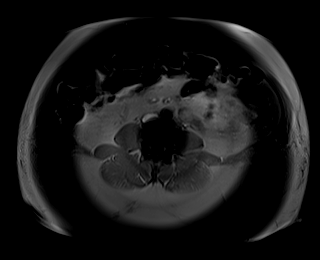
[im 44/88]
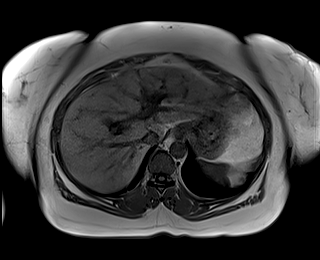
[im 88/88]
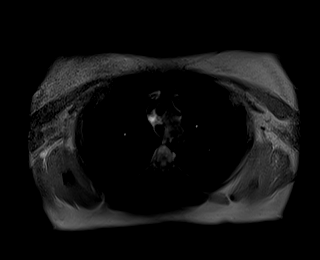

[Series 14: cor obl thk · sagittal · 50.0mm · 0.78mm/px · 1 of 8 slices shown]
[im 1/8]
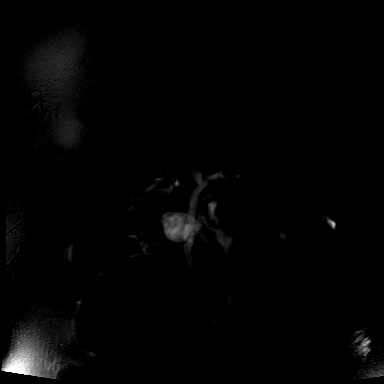

[Series 15: T2 · axial · 6.0mm · 1.68mm/px · 1 of 41 slices shown (2 of 2)]
[im 1/41]
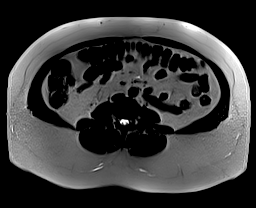

[Series 17: T1 dynamic · axial · 3.0mm · 1.34mm/px · z∈[-87,+222]mm · 3 of 104 slices shown (1 of 9)]
[im 1/104]
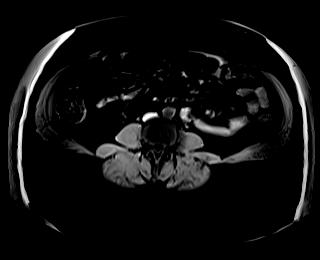
[im 52/104]
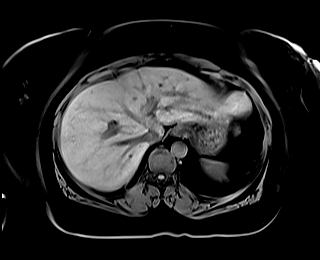
[im 104/104]
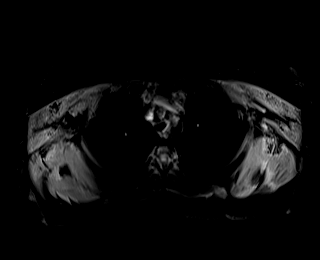

[Series 21: T1 dynamic · axial · 3.0mm · 1.34mm/px · z∈[-87,+222]mm · 3 of 104 slices shown (2 of 9)]
[im 1/104]
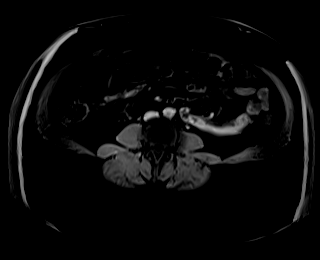
[im 52/104]
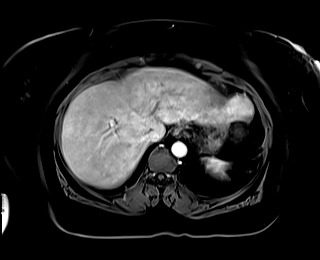
[im 104/104]
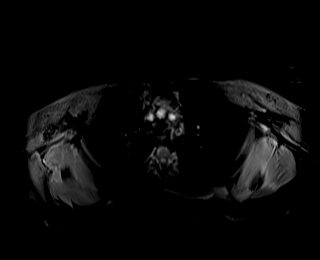

[Series 22: T1 dynamic · axial · 3.0mm · 1.34mm/px · z∈[-87,+222]mm · 3 of 104 slices shown (3 of 9)]
[im 1/104]
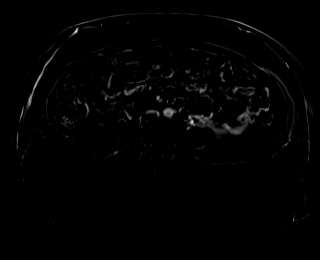
[im 52/104]
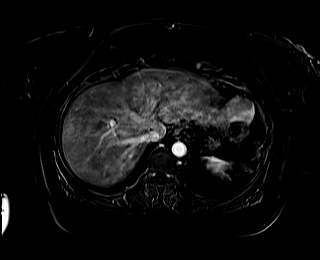
[im 104/104]
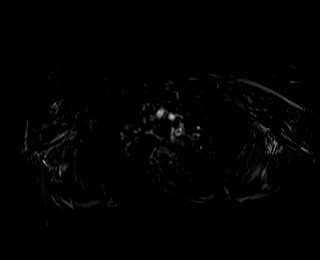

[Series 25: T1 dynamic · axial · 3.0mm · 1.34mm/px · z∈[-87,+222]mm · 3 of 104 slices shown (4 of 9)]
[im 1/104]
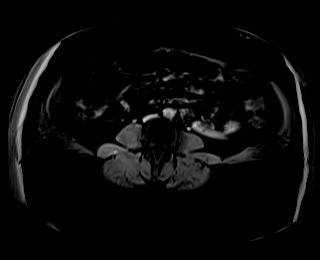
[im 52/104]
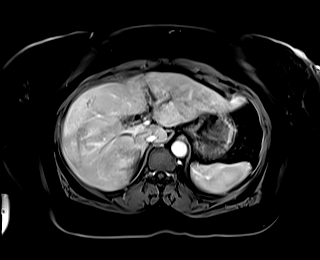
[im 104/104]
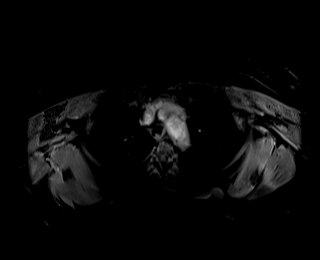

[Series 26: T1 dynamic · axial · 3.0mm · 1.34mm/px · z∈[-87,+222]mm · 3 of 104 slices shown (5 of 9)]
[im 1/104]
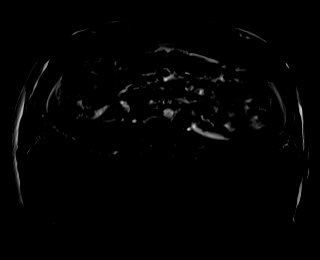
[im 52/104]
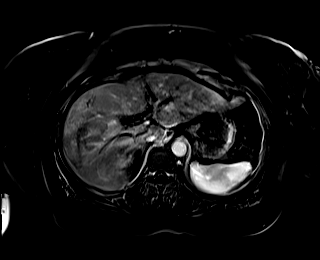
[im 104/104]
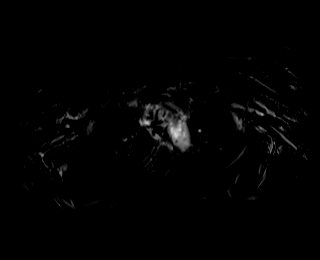

[Series 29: T1 dynamic · axial · 3.0mm · 1.34mm/px · z∈[-87,+222]mm · 3 of 104 slices shown (6 of 9)]
[im 1/104]
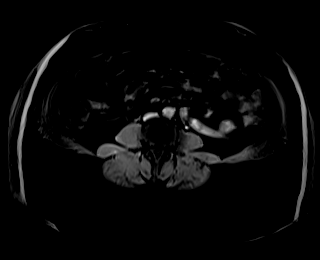
[im 52/104]
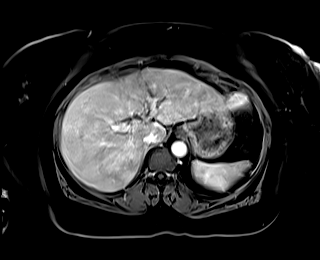
[im 104/104]
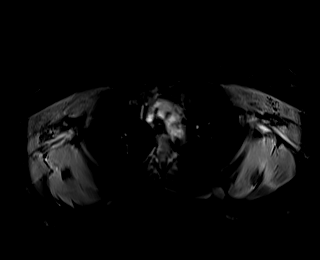

[Series 30: T1 dynamic · axial · 3.0mm · 1.34mm/px · z∈[-87,+222]mm · 3 of 104 slices shown (7 of 9)]
[im 1/104]
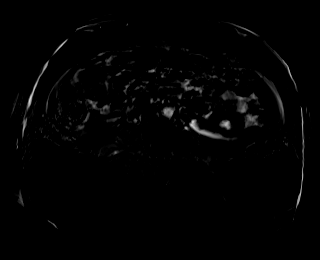
[im 52/104]
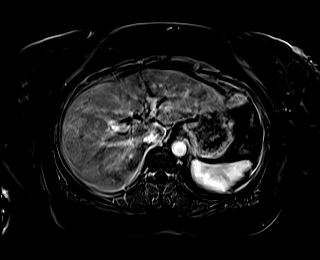
[im 104/104]
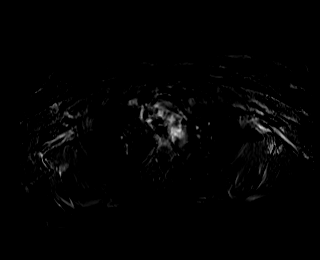

[Series 32: T1 dynamic · coronal · 5.0mm · 1.41mm/px · 2 of 56 slices shown (8 of 9)]
[im 1/56]
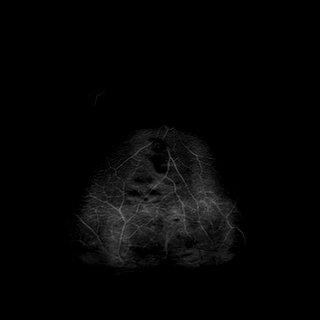
[im 56/56]
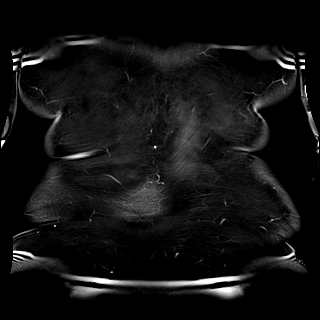

[Series 35: T1 dynamic · axial · 3.0mm · 1.34mm/px · z∈[-87,+222]mm · 3 of 104 slices shown (9 of 9)]
[im 1/104]
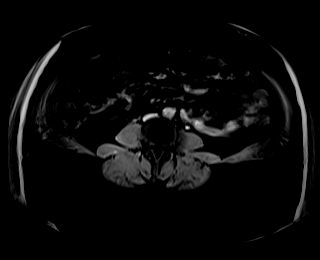
[im 52/104]
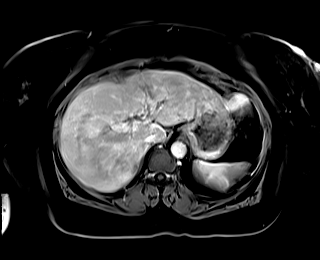
[im 104/104]
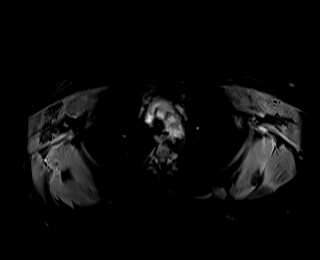

[43 of 48 positions shown; findings below may reference images not displayed]

FINDINGS: Lower chest: Mild bilateral lower lobe atelectasis versus
infiltrates.

Hepatobiliary: A 2.3 cm benign hemangioma is seen in segment 2 of
the left hepatic lobe. No other hepatic masses are identified.

Several small gallstones are seen. The gallbladder is distended and
shows mild diffuse gallbladder wall thickening and pericholecystic
edema, consistent with acute cholecystitis. No evidence of biliary
ductal dilatation or choledocholithiasis.

Pancreas:  No mass or inflammatory changes.

Spleen:  Within normal limits in size and appearance.

Adrenals/Urinary Tract: No masses identified. No evidence of
hydronephrosis.

Stomach/Bowel: Unremarkable.

Vascular/Lymphatic: No pathologically enlarged lymph nodes
identified. No acute vascular findings.

Other:  None.

Musculoskeletal:  No suspicious bone lesions identified.
IMPRESSION: Findings consistent with acute cholecystitis.

No evidence of biliary ductal dilatation or choledocholithiasis.

2.3 cm benign hemangioma in the left hepatic lobe.

Mild bilateral lower lobe atelectasis versus infiltrates.

## 2021-08-08 IMAGING — MR MR 3D RECON AT SCANNER
18 of 20 series · 43 of 48 positions shown · IV contrast (gadavist)
Comparison: None Available.

CLINICAL DATA: Diffuse abdominal pain and nausea and vomiting for 2
weeks. Cholelithiasis.

EXAM:
MRI ABDOMEN WITHOUT AND WITH CONTRAST (INCLUDING MRCP)
TECHNIQUE: Multiplanar multisequence MR imaging of the abdomen was performed
both before and after the administration of intravenous contrast.
Heavily T2-weighted images of the biliary and pancreatic ducts were
obtained, and three-dimensional MRCP images were rendered by post
processing.
CONTRAST:  10mL GADAVIST GADOBUTROL 1 MMOL/ML IV SOLN

[Series 3: T2 fat-sat · axial · 6.0mm · 1.34mm/px · z∈[-82,+206]mm · 2 of 41 slices shown]
[im 1/41]
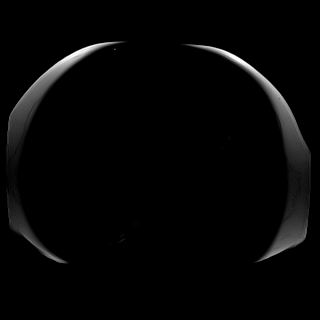
[im 41/41]
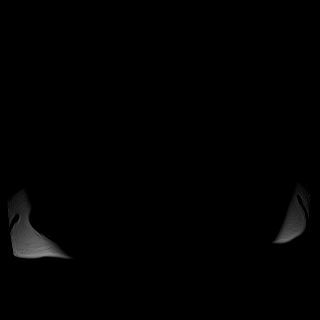

[Series 5: DWI · axial · 6.0mm · 1.60mm/px · z∈[-73,+215]mm · 3 of 82 slices shown (1 of 2)]
[im 1/82]
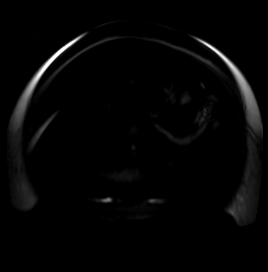
[im 41/82]
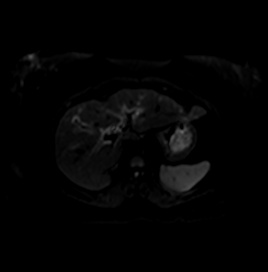
[im 82/82]
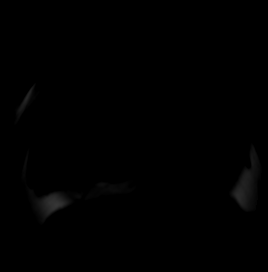

[Series 6: DWI · axial · 6.0mm · 1.60mm/px · 1 of 41 slices shown (2 of 2)]
[im 1/41]
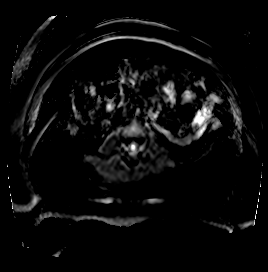

[Series 8: cor_3d_spc_trig · coronal · 1.0mm · 0.49mm/px · 2 of 70 slices shown]
[im 1/70]
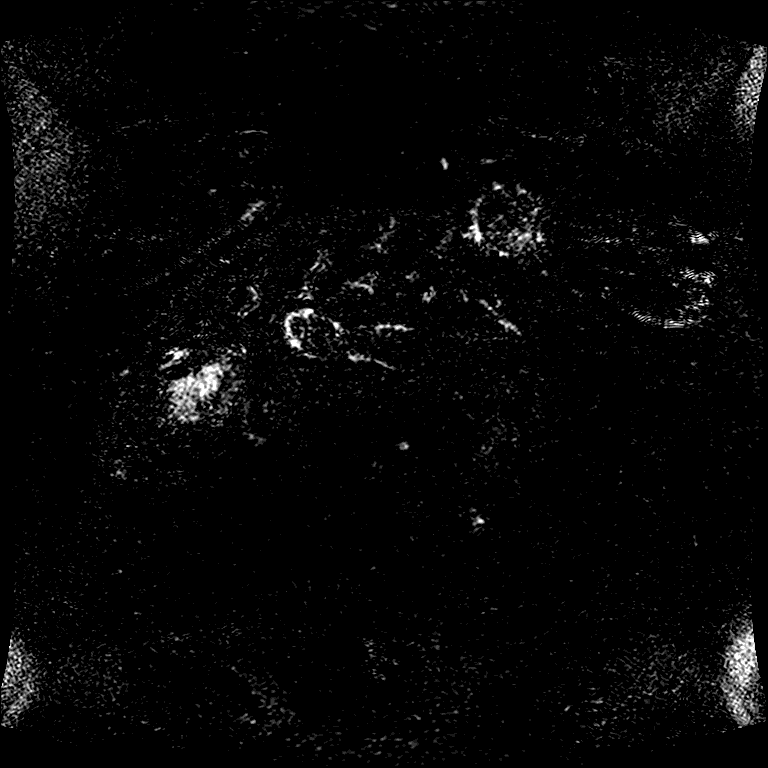
[im 70/70]
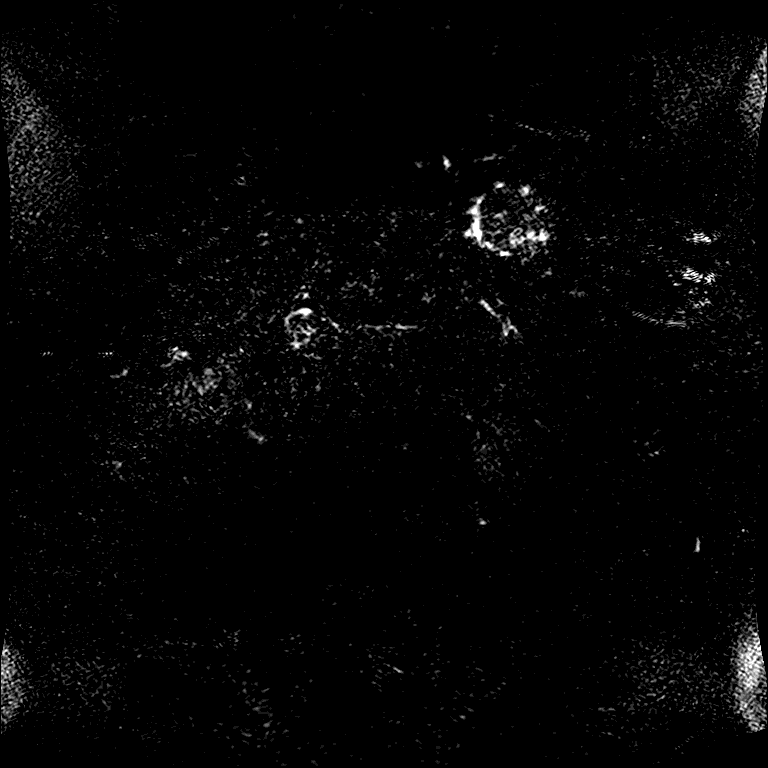

[Series 13: T2 · coronal · 6.0mm · 1.48mm/px · 1 of 43 slices shown (1 of 2)]
[im 1/43]
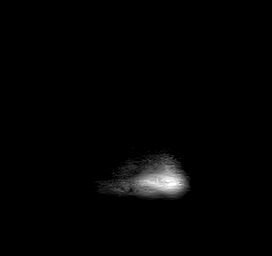

[Series 14: T1 · axial · 3.5mm · 1.34mm/px · z∈[-90,+214]mm · 3 of 88 slices shown (1 of 2)]
[im 1/88]
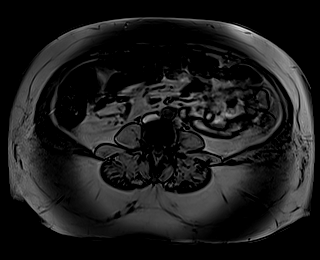
[im 44/88]
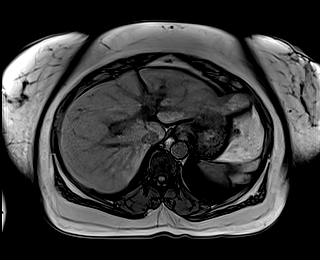
[im 88/88]
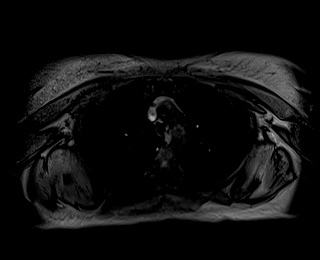

[Series 15: T1 · axial · 3.5mm · 1.34mm/px · z∈[-90,+214]mm · 3 of 88 slices shown (2 of 2)]
[im 1/88]
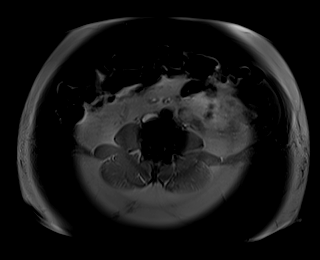
[im 44/88]
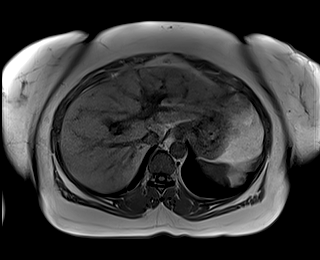
[im 88/88]
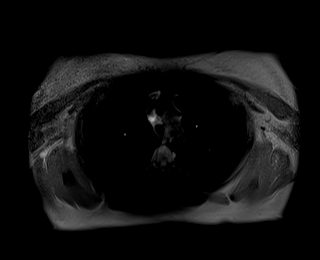

[Series 16: cor obl thk · sagittal · 50.0mm · 0.78mm/px · 1 of 8 slices shown]
[im 1/8]
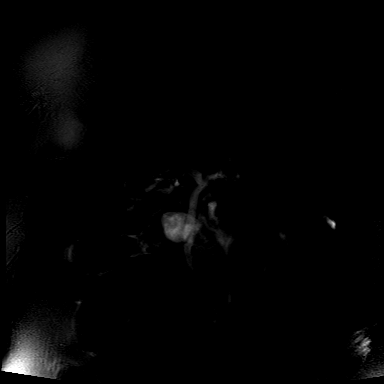

[Series 17: T2 · axial · 6.0mm · 1.68mm/px · 1 of 41 slices shown (2 of 2)]
[im 1/41]
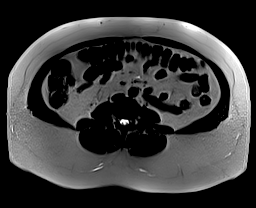

[Series 19: T1 dynamic · axial · 3.0mm · 1.34mm/px · z∈[-87,+222]mm · 3 of 104 slices shown (1 of 9)]
[im 1/104]
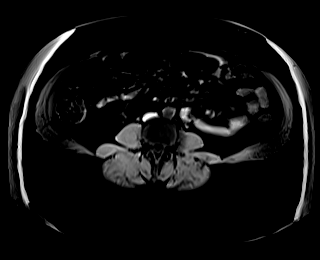
[im 52/104]
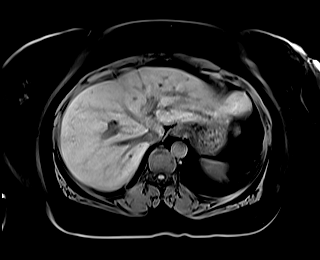
[im 104/104]
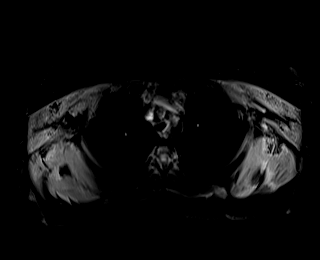

[Series 23: T1 dynamic · axial · 3.0mm · 1.34mm/px · z∈[-87,+222]mm · 3 of 104 slices shown (2 of 9)]
[im 1/104]
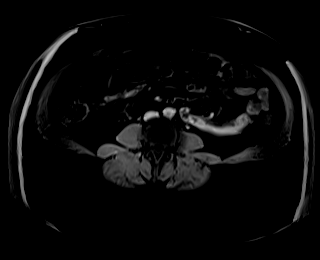
[im 52/104]
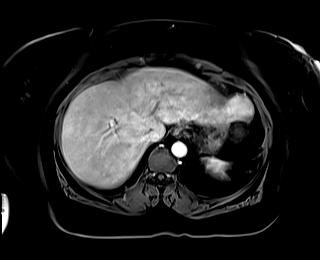
[im 104/104]
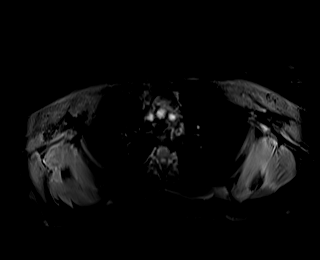

[Series 24: T1 dynamic · axial · 3.0mm · 1.34mm/px · z∈[-87,+222]mm · 3 of 104 slices shown (3 of 9)]
[im 1/104]
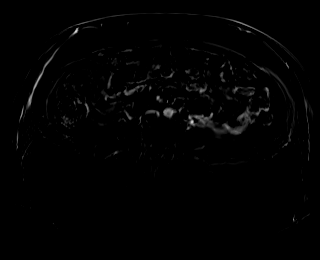
[im 52/104]
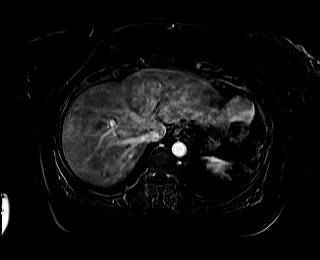
[im 104/104]
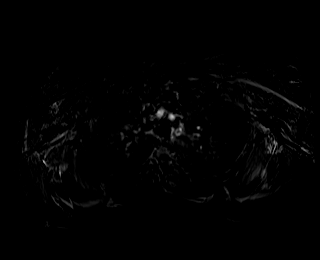

[Series 27: T1 dynamic · axial · 3.0mm · 1.34mm/px · z∈[-87,+222]mm · 3 of 104 slices shown (4 of 9)]
[im 1/104]
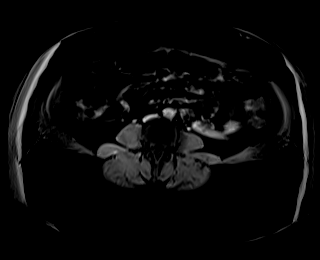
[im 52/104]
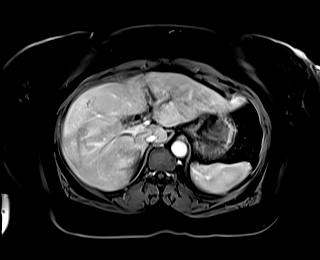
[im 104/104]
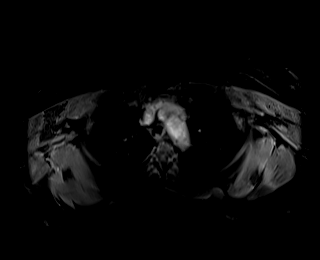

[Series 28: T1 dynamic · axial · 3.0mm · 1.34mm/px · z∈[-87,+222]mm · 3 of 104 slices shown (5 of 9)]
[im 1/104]
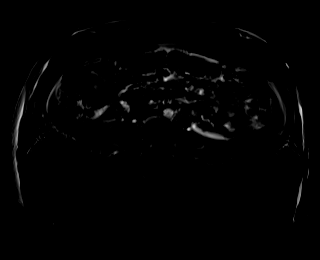
[im 52/104]
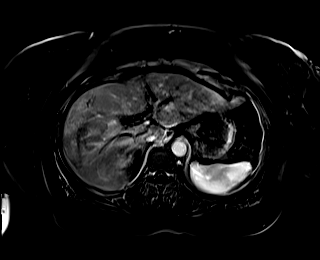
[im 104/104]
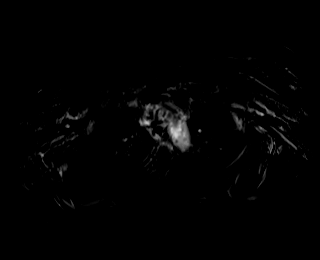

[Series 31: T1 dynamic · axial · 3.0mm · 1.34mm/px · z∈[-87,+222]mm · 3 of 104 slices shown (6 of 9)]
[im 1/104]
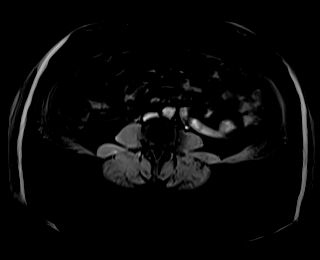
[im 52/104]
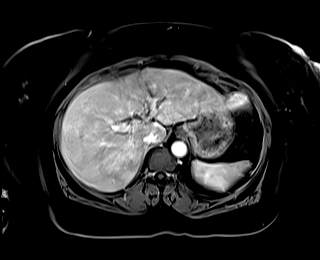
[im 104/104]
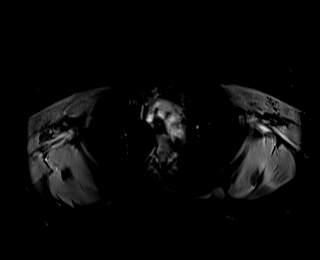

[Series 32: T1 dynamic · axial · 3.0mm · 1.34mm/px · z∈[-87,+222]mm · 3 of 104 slices shown (7 of 9)]
[im 1/104]
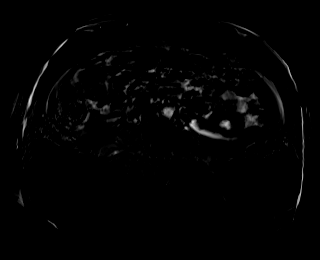
[im 52/104]
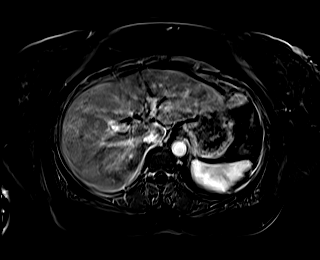
[im 104/104]
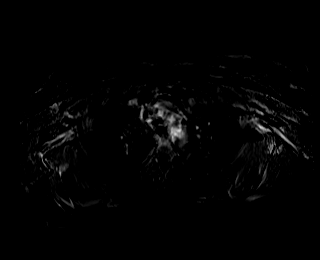

[Series 34: T1 dynamic · coronal · 5.0mm · 1.41mm/px · 2 of 56 slices shown (8 of 9)]
[im 1/56]
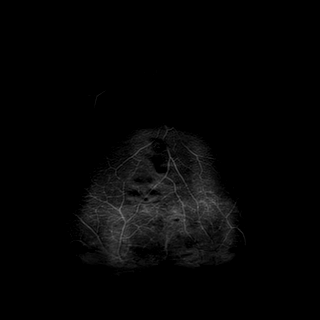
[im 56/56]
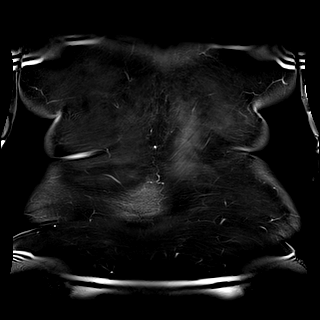

[Series 37: T1 dynamic · axial · 3.0mm · 1.34mm/px · z∈[-87,+222]mm · 3 of 104 slices shown (9 of 9)]
[im 1/104]
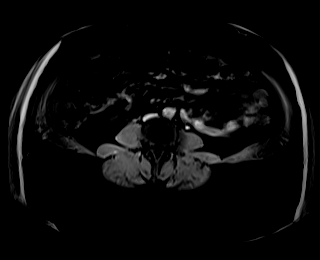
[im 52/104]
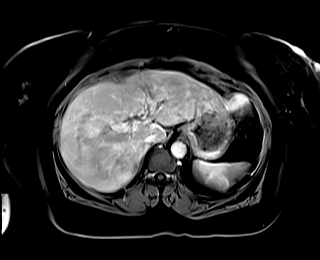
[im 104/104]
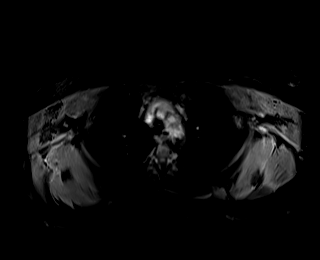

[43 of 48 positions shown; findings below may reference images not displayed]

FINDINGS: Lower chest: Mild bilateral lower lobe atelectasis versus
infiltrates.

Hepatobiliary: A 2.3 cm benign hemangioma is seen in segment 2 of
the left hepatic lobe. No other hepatic masses are identified.

Several small gallstones are seen. The gallbladder is distended and
shows mild diffuse gallbladder wall thickening and pericholecystic
edema, consistent with acute cholecystitis. No evidence of biliary
ductal dilatation or choledocholithiasis.

Pancreas:  No mass or inflammatory changes.

Spleen:  Within normal limits in size and appearance.

Adrenals/Urinary Tract: No masses identified. No evidence of
hydronephrosis.

Stomach/Bowel: Unremarkable.

Vascular/Lymphatic: No pathologically enlarged lymph nodes
identified. No acute vascular findings.

Other:  None.

Musculoskeletal:  No suspicious bone lesions identified.
IMPRESSION: Findings consistent with acute cholecystitis.

No evidence of biliary ductal dilatation or choledocholithiasis.

2.3 cm benign hemangioma in the left hepatic lobe.

Mild bilateral lower lobe atelectasis versus infiltrates.

## 2021-08-08 MED ORDER — GADOBUTROL 1 MMOL/ML IV SOLN
10.0000 mL | Freq: Once | INTRAVENOUS | Status: AC | PRN
Start: 2021-08-08 — End: 2021-08-08
  Administered 2021-08-08: 10 mL via INTRAVENOUS

## 2021-08-08 MED ORDER — ENOXAPARIN SODIUM 40 MG/0.4ML IJ SOSY
40.0000 mg | PREFILLED_SYRINGE | INTRAMUSCULAR | Status: DC
  Administered 2021-08-09 (×2): 40 mg via SUBCUTANEOUS
  Filled 2021-08-08 (×2): qty 0.4

## 2021-08-08 NOTE — Progress Notes (Signed)
Patient ID: Kaylee Nixon, female   DOB: 11/08/1970, 51 y.o.   MRN: [id number]   MRCP shows a normal bile duct with no evidence of common bile duct stone The liver lesion does appear to be consistent with hemangioma.  I discussed the findings with the patient and the plan will be to proceed with a laparoscopic cholecystectomy and possible cholangiogram in the morning.  She can have clear liquids and will be n.p.o. at midnight

## 2021-08-08 NOTE — Progress Notes (Addendum)
   Subjective/Chief Complaint: Patient still with moderate RUQ abdominal pain, sharp   Objective: Vital signs in last 24 hours: Temp:  [98.2 F (36.8 C)-98.5 F (36.9 C)] 98.2 F (36.8 C) (06/03 0500) Pulse Rate:  [88-102] 88 (06/03 0500) Resp:  [12-21] 17 (06/03 0500) BP: (139-188)/(78-112) 153/83 (06/03 0500) SpO2:  [96 %-100 %] 98 % (06/03 0500) Weight:  [112.9 kg-113.6 kg] 113.6 kg (06/02 2120) Last BM Date : 08/06/21  Intake/Output from previous day: 06/02 0701 - 06/03 0700 In: 1031 [I.V.:531; IV Piggyback:500] Out: -  Intake/Output this shift: No intake/output data recorded.  Exam: Awake and alert Looks uncomfortable Abdomen soft, obese, tender in the RUQ  Lab Results:  Recent Labs    08/07/21 1618 08/08/21 0403  WBC 8.5 6.6  HGB 10.7* 10.5*  HCT 34.4* 33.5*  PLT 387 391   BMET Recent Labs    08/07/21 1618 08/08/21 0403  NA 140 139  K 3.7 3.6  CL 104 107  CO2 27 25  GLUCOSE 120* 115*  BUN 11 8  CREATININE 0.83 0.63  CALCIUM 8.7* 8.8*   PT/INR No results for input(s): LABPROT, INR in the last 72 hours. ABG No results for input(s): PHART, HCO3 in the last 72 hours.  Invalid input(s): PCO2, PO2  Studies/Results: US Abdomen Limited RUQ (LIVER/GB)  Result Date: 08/07/2021 CLINICAL DATA:  Epigastric pain for 3 weeks EXAM: ULTRASOUND ABDOMEN LIMITED RIGHT UPPER QUADRANT COMPARISON:  None Available. FINDINGS: Gallbladder: Gallstones: Numerous gallstones measuring up to 1.4 cm. Sludge: Present Gallbladder Wall: Mild gallbladder wall thickening measuring up to 4 mm. Pericholecystic fluid: None Sonographic Murphy's Sign: Negative per technologist Common bile duct: Diameter: 6-7 mm Liver: Parenchymal echogenicity: Within normal limits Contours: Normal Lesions: None Portal vein: Patent.  Hepatopetal flow Other: None. IMPRESSION: Moderate cholelithiasis with mild gallbladder wall thickening suspicious for cholecystitis. HIDA scan would be beneficial if  clinically appropriate. Electronically Signed   By: Miachel Roux M.D.   On: 08/07/2021 16:57    Anti-infectives: Anti-infectives (From admission, onward)    None       Assessment/Plan: Symptomatic cholelithiasis  LFT's up today with T.bili up to 2.6.  Worrisome for CBD obstruction by gallstone.  Will need an MRCP to r/o CBD stone.  If positive, will consult GI. Plan Lap Chole tomorrow if negative  Moderately complex medical decision making   Coralie Keens MD 08/08/2021

## 2021-08-09 ENCOUNTER — Inpatient Hospital Stay (HOSPITAL_COMMUNITY): Payer: BC Managed Care – PPO | Admitting: Certified Registered"

## 2021-08-09 ENCOUNTER — Encounter (HOSPITAL_COMMUNITY): Admission: EM | Disposition: A | Discharge status: Home / Self Care | Payer: Self-pay | Source: Home / Self Care

## 2021-08-09 ENCOUNTER — Other Ambulatory Visit: Payer: Self-pay

## 2021-08-09 ENCOUNTER — Inpatient Hospital Stay (HOSPITAL_COMMUNITY): Payer: BC Managed Care – PPO

## 2021-08-09 ENCOUNTER — Encounter (HOSPITAL_COMMUNITY): Payer: Self-pay

## 2021-08-09 HISTORY — PX: CHOLECYSTECTOMY: SHX55

## 2021-08-09 SURGERY — LAPAROSCOPIC CHOLECYSTECTOMY WITH INTRAOPERATIVE CHOLANGIOGRAM
Anesthesia: General | Site: Abdomen

## 2021-08-09 MED ORDER — AMISULPRIDE (ANTIEMETIC) 5 MG/2ML IV SOLN: 10.0000 mg | Freq: Once | INTRAVENOUS | Status: DC | PRN

## 2021-08-09 MED ORDER — ROCURONIUM BROMIDE 10 MG/ML (PF) SYRINGE
PREFILLED_SYRINGE | INTRAVENOUS | Status: DC | PRN
  Administered 2021-08-09: 50 mg via INTRAVENOUS

## 2021-08-09 MED ORDER — DEXAMETHASONE SODIUM PHOSPHATE 10 MG/ML IJ SOLN
INTRAMUSCULAR | Status: DC | PRN
  Administered 2021-08-09: 10 mg via INTRAVENOUS

## 2021-08-09 MED ORDER — MIDAZOLAM HCL 2 MG/2ML IJ SOLN
INTRAMUSCULAR | Status: AC
Start: 2021-08-09 — End: ?
  Filled 2021-08-09: qty 2

## 2021-08-09 MED ORDER — CEFAZOLIN SODIUM-DEXTROSE 2-3 GM-%(50ML) IV SOLR
INTRAVENOUS | Status: DC | PRN
  Administered 2021-08-09: 2 g via INTRAVENOUS

## 2021-08-09 MED ORDER — LABETALOL HCL 5 MG/ML IV SOLN
10.0000 mg | Freq: Once | INTRAVENOUS | Status: AC
  Administered 2021-08-09: 10 mg via INTRAVENOUS

## 2021-08-09 MED ORDER — LACTATED RINGERS IR SOLN
Status: DC | PRN
  Administered 2021-08-09: 1000 mL

## 2021-08-09 MED ORDER — LIDOCAINE HCL (PF) 2 % IJ SOLN
INTRAMUSCULAR | Status: AC
  Filled 2021-08-09: qty 5

## 2021-08-09 MED ORDER — LIDOCAINE 2% (20 MG/ML) 5 ML SYRINGE
INTRAMUSCULAR | Status: DC | PRN
  Administered 2021-08-09: 60 mg via INTRAVENOUS

## 2021-08-09 MED ORDER — FENTANYL CITRATE PF 50 MCG/ML IJ SOSY
PREFILLED_SYRINGE | INTRAMUSCULAR | Status: AC
  Filled 2021-08-09: qty 3

## 2021-08-09 MED ORDER — FENTANYL CITRATE (PF) 250 MCG/5ML IJ SOLN
INTRAMUSCULAR | Status: AC
  Filled 2021-08-09: qty 5

## 2021-08-09 MED ORDER — HYDROMORPHONE HCL 1 MG/ML IJ SOLN: 1.0000 mg | INTRAMUSCULAR | Status: DC | PRN

## 2021-08-09 MED ORDER — PROPOFOL 10 MG/ML IV BOLUS
INTRAVENOUS | Status: AC
  Filled 2021-08-09: qty 20

## 2021-08-09 MED ORDER — LABETALOL HCL 5 MG/ML IV SOLN
INTRAVENOUS | Status: AC
  Filled 2021-08-09: qty 4

## 2021-08-09 MED ORDER — ROCURONIUM BROMIDE 10 MG/ML (PF) SYRINGE
PREFILLED_SYRINGE | INTRAVENOUS | Status: AC
Start: 2021-08-09 — End: ?
  Filled 2021-08-09: qty 10

## 2021-08-09 MED ORDER — HYDROMORPHONE HCL 1 MG/ML IJ SOLN
0.5000 mg | INTRAMUSCULAR | Status: DC | PRN
  Administered 2021-08-09 (×2): 0.5 mg via INTRAVENOUS
  Filled 2021-08-09 (×2): qty 0.5

## 2021-08-09 MED ORDER — SUGAMMADEX SODIUM 200 MG/2ML IV SOLN
INTRAVENOUS | Status: DC | PRN
  Administered 2021-08-09: 300 mg via INTRAVENOUS

## 2021-08-09 MED ORDER — FENTANYL CITRATE (PF) 250 MCG/5ML IJ SOLN
INTRAMUSCULAR | Status: DC | PRN
  Administered 2021-08-09: 50 ug via INTRAVENOUS
  Administered 2021-08-09: 100 ug via INTRAVENOUS
  Administered 2021-08-09: 50 ug via INTRAVENOUS

## 2021-08-09 MED ORDER — MIDAZOLAM HCL 5 MG/5ML IJ SOLN
INTRAMUSCULAR | Status: DC | PRN
  Administered 2021-08-09: 2 mg via INTRAVENOUS

## 2021-08-09 MED ORDER — TRAMADOL HCL 50 MG PO TABS
50.0000 mg | ORAL_TABLET | Freq: Four times a day (QID) | ORAL | Status: DC | PRN
  Administered 2021-08-09 – 2021-08-10 (×2): 50 mg via ORAL
  Filled 2021-08-09 (×2): qty 1

## 2021-08-09 MED ORDER — BUPIVACAINE-EPINEPHRINE (PF) 0.25% -1:200000 IJ SOLN
INTRAMUSCULAR | Status: AC
  Filled 2021-08-09: qty 30

## 2021-08-09 MED ORDER — OXYCODONE HCL 5 MG PO TABS
5.0000 mg | ORAL_TABLET | ORAL | Status: DC | PRN
  Administered 2021-08-09 – 2021-08-10 (×2): 5 mg via ORAL
  Filled 2021-08-09 (×2): qty 1

## 2021-08-09 MED ORDER — FENTANYL CITRATE PF 50 MCG/ML IJ SOSY
25.0000 ug | PREFILLED_SYRINGE | INTRAMUSCULAR | Status: DC | PRN
  Administered 2021-08-09 (×3): 50 ug via INTRAVENOUS

## 2021-08-09 MED ORDER — ONDANSETRON HCL 4 MG/2ML IJ SOLN
INTRAMUSCULAR | Status: DC | PRN
  Administered 2021-08-09: 4 mg via INTRAVENOUS

## 2021-08-09 MED ORDER — LACTATED RINGERS IV SOLN: INTRAVENOUS | Status: DC

## 2021-08-09 MED ORDER — DEXAMETHASONE SODIUM PHOSPHATE 10 MG/ML IJ SOLN
INTRAMUSCULAR | Status: AC
Start: 2021-08-09 — End: ?
  Filled 2021-08-09: qty 1

## 2021-08-09 MED ORDER — 0.9 % SODIUM CHLORIDE (POUR BTL) OPTIME
TOPICAL | Status: DC | PRN
  Administered 2021-08-09: 1000 mL

## 2021-08-09 MED ORDER — PROPOFOL 10 MG/ML IV BOLUS
INTRAVENOUS | Status: DC | PRN
  Administered 2021-08-09: 170 mg via INTRAVENOUS

## 2021-08-09 MED ORDER — CEFAZOLIN SODIUM-DEXTROSE 2-4 GM/100ML-% IV SOLN
INTRAVENOUS | Status: AC
  Filled 2021-08-09: qty 100

## 2021-08-09 SURGICAL SUPPLY — 37 items
ADH SKN CLS APL DERMABOND .7 (GAUZE/BANDAGES/DRESSINGS) ×1
APL PRP STRL LF DISP 70% ISPRP (MISCELLANEOUS) ×1
APPLIER CLIP 5 13 M/L LIGAMAX5 (MISCELLANEOUS) ×2
APR CLP MED LRG 5 ANG JAW (MISCELLANEOUS) ×1
BAG COUNTER SPONGE SURGICOUNT (BAG) IMPLANT
BAG RETRIEVAL 10 (BASKET) ×1
BAG SPNG CNTER NS LX DISP (BAG)
CABLE HIGH FREQUENCY MONO STRZ (ELECTRODE) ×2 IMPLANT
CHLORAPREP W/TINT 26 (MISCELLANEOUS) ×2 IMPLANT
CLIP APPLIE 5 13 M/L LIGAMAX5 (MISCELLANEOUS) ×1 IMPLANT
COVER MAYO STAND XLG (MISCELLANEOUS) ×2 IMPLANT
DERMABOND ADVANCED (GAUZE/BANDAGES/DRESSINGS) ×1
DERMABOND ADVANCED .7 DNX12 (GAUZE/BANDAGES/DRESSINGS) ×1 IMPLANT
DRAPE C-ARM 42X120 X-RAY (DRAPES) IMPLANT
ELECT REM PT RETURN 15FT ADLT (MISCELLANEOUS) ×2 IMPLANT
GLOVE BIO SURGEON STRL SZ7.5 (GLOVE) ×2 IMPLANT
GOWN STRL REUS W/ TWL XL LVL3 (GOWN DISPOSABLE) ×2 IMPLANT
GOWN STRL REUS W/TWL XL LVL3 (GOWN DISPOSABLE) ×4
HEMOSTAT SURGICEL 4X8 (HEMOSTASIS) IMPLANT
IRRIG SUCT STRYKERFLOW 2 WTIP (MISCELLANEOUS) ×2
IRRIGATION SUCT STRKRFLW 2 WTP (MISCELLANEOUS) ×1 IMPLANT
KIT BASIN OR (CUSTOM PROCEDURE TRAY) ×2 IMPLANT
KIT TURNOVER KIT A (KITS) IMPLANT
PENCIL SMOKE EVACUATOR (MISCELLANEOUS) IMPLANT
SCISSORS LAP 5X35 DISP (ENDOMECHANICALS) ×2 IMPLANT
SET CHOLANGIOGRAPH MIX (MISCELLANEOUS) IMPLANT
SET TUBE SMOKE EVAC HIGH FLOW (TUBING) ×2 IMPLANT
SLEEVE Z-THREAD 5X100MM (TROCAR) ×4 IMPLANT
SPIKE FLUID TRANSFER (MISCELLANEOUS) ×2 IMPLANT
SUT MNCRL AB 4-0 PS2 18 (SUTURE) ×2 IMPLANT
SYS BAG RETRIEVAL 10MM (BASKET) ×1
SYSTEM BAG RETRIEVAL 10MM (BASKET) ×1 IMPLANT
TOWEL OR 17X26 10 PK STRL BLUE (TOWEL DISPOSABLE) ×2 IMPLANT
TOWEL OR NON WOVEN STRL DISP B (DISPOSABLE) ×2 IMPLANT
TRAY LAPAROSCOPIC (CUSTOM PROCEDURE TRAY) ×2 IMPLANT
TROCAR BALLN 12MMX100 BLUNT (TROCAR) ×2 IMPLANT
TROCAR Z-THREAD OPTICAL 5X100M (TROCAR) ×2 IMPLANT

## 2021-08-09 NOTE — Transfer of Care (Signed)
Immediate Anesthesia Transfer of Care Note  Patient: Kaylee Nixon  Procedure(s) Performed: LAPAROSCOPIC CHOLECYSTECTOMY (Abdomen)  Patient Location: PACU  Anesthesia Type:General  Level of Consciousness: awake, alert , oriented, patient cooperative and responds to stimulation  Airway & Oxygen Therapy: Patient Spontanous Breathing and Patient connected to face mask oxygen  Post-op Assessment: Report given to RN, Post -op Vital signs reviewed and stable and Patient moving all extremities  Post vital signs: Reviewed and stable  Last Vitals:  Vitals Value Taken Time  BP 183/103 08/09/21 0828  Temp    Pulse 86 08/09/21 0829  Resp 11 08/09/21 0829  SpO2 99 % 08/09/21 0829  Vitals shown include unvalidated device data.  Last Pain:  Vitals:   08/09/21 0546  TempSrc: Oral  PainSc:       Patients Stated Pain Goal: 2 (34/74/25 9563)  Complications: No notable events documented.

## 2021-08-09 NOTE — Progress Notes (Signed)
Patient ID: Kaylee Nixon, female   DOB: [birth year], 51 y.o.   MRN: [id number]   Pre Procedure note for inpatients:   Kaylee Nixon has been scheduled for Procedure(s): LAPAROSCOPIC CHOLECYSTECTOMY WITH POSSIBLE INTRAOPERATIVE CHOLANGIOGRAM (N/A) today. The various methods of treatment have been discussed with the patient. After consideration of the risks, benefits and treatment options the patient has consented to the planned procedure.   I discussed the procedure in detail.    We discussed the risks and benefits of a laparoscopic cholecystectomy and possible cholangiogram including, but not limited to bleeding, infection, injury to surrounding structures such as the intestine or liver, bile leak, retained gallstones, need to convert to an open procedure, prolonged diarrhea, blood clots such as  DVT, common bile duct injury, anesthesia risks, and possible need for additional procedures.  The likelihood of improvement in symptoms and return to the patient's normal status is good. We discussed the typical post-operative recovery course.   The patient has been seen and labs reviewed. There are no changes in the patient's condition to prevent proceeding with the planned procedure today.  Recent labs:  Lab Results  Component Value Date   WBC 6.6 08/08/2021   HGB 10.5 (L) 08/08/2021   HCT 33.5 (L) 08/08/2021   PLT 391 08/08/2021   GLUCOSE 120 (H) 08/08/2021   ALT 203 (H) 08/08/2021   AST 155 (H) 08/08/2021   NA 137 08/08/2021   K 4.0 08/08/2021   CL 108 08/08/2021   CREATININE 0.61 08/08/2021   BUN 6 08/08/2021   CO2 21 (L) 08/08/2021    Coralie Keens, MD 08/09/2021 6:54 AM

## 2021-08-09 NOTE — Op Note (Signed)
Laparoscopic Cholecystectomy Procedure Note  Indications: This patient presents with symptomatic gallbladder disease and will undergo laparoscopic cholecystectomy.  Preop MRCP with normal CBD and no evidence of obstructing stone  Pre-operative Diagnosis: acute cholecystitis with cholelithiasis  Post-operative Diagnosis: Same  Surgeon: Coralie Keens   Assistants: 0  Anesthesia: General endotracheal anesthesia  ASA Class: 2  Procedure Details  The patient was seen again in the Holding Room. The risks, benefits, complications, treatment options, and expected outcomes were discussed with the patient. The possibilities of reaction to medication, pulmonary aspiration, perforation of viscus, bleeding, recurrent infection, finding a normal gallbladder, the need for additional procedures, failure to diagnose a condition, the possible need to convert to an open procedure, and creating a complication requiring transfusion or operation were discussed with the patient. The likelihood of improving the patient's symptoms with return to their baseline status is good.  The patient and/or family concurred with the proposed plan, giving informed consent. The site of surgery properly noted. The patient was taken to Operating Room, identified as Kaylee Nixon and the procedure verified as Laparoscopic Cholecystectomy with Intraoperative Cholangiogram. A Time Out was held and the above information confirmed.  Prior to the induction of general anesthesia, antibiotic prophylaxis was administered. General endotracheal anesthesia was then administered and tolerated well. After the induction, the abdomen was prepped with Chloraprep and draped in sterile fashion. The patient was positioned in the supine position.  Local anesthetic agent was injected into the skin near the umbilicus and an incision made through a previous scar. We dissected down to the abdominal fascia with blunt dissection.  The fascia was incised  vertically and we entered the peritoneal cavity bluntly.  A pursestring suture of 0-Vicryl was placed around the fascial opening.  The Hasson cannula was inserted and secured with the stay suture.  Pneumoperitoneum was then created with CO2 and tolerated well without any adverse changes in the patient's vital signs. An 11-mm port was placed in the subxiphoid position.  Two 5-mm ports were placed in the right upper quadrant. All skin incisions were infiltrated with a local anesthetic agent before making the incision and placing the trocars.   We positioned the patient in reverse Trendelenburg, tilted slightly to the patient's left.  The gallbladder was identified and found to be distended and acutely inflamed.  The fundus grasped and retracted cephalad. Adhesions were lysed bluntly and with the electrocautery where indicated, taking care not to injure any adjacent organs or viscus. The infundibulum was grasped and retracted laterally, exposing the peritoneum overlying the triangle of Calot. This was then divided and exposed in a blunt fashion. The cystic duct was clearly identified and bluntly dissected circumferentially. A critical view of the cystic duct and cystic artery was obtained.  The cystic duct was then ligated with clips and divided. The cystic artery was, dissected free, ligated with clips and divided as well.   The gallbladder was dissected from the liver bed in retrograde fashion with the electrocautery. The gallbladder was removed and placed in an Endocatch sac. The liver bed was irrigated and inspected. Hemostasis was achieved with the electrocautery. Copious irrigation was utilized and was repeatedly aspirated until clear.  The gallbladder and Endocatch sac were then removed through the umbilical port site.  The pursestring suture was used to close the umbilical fascia.    We again inspected the right upper quadrant for hemostasis.  Pneumoperitoneum was released as we removed the trocars.  4-0  Monocryl was used to close the skin.  Skin glue was then applied. The patient was then extubated and brought to the recovery room in stable condition. Instrument, sponge, and needle counts were correct at closure and at the conclusion of the case.   Findings: Acute Cholecystitis with Cholelithiasis  Estimated Blood Loss: Minimal         Drains: 0         Specimens: Gallbladder           Complications: None; patient tolerated the procedure well.         Disposition: PACU - hemodynamically stable.         Condition: stable

## 2021-08-09 NOTE — Anesthesia Preprocedure Evaluation (Signed)
Anesthesia Evaluation  Patient identified by MRN, date of birth, ID band Patient awake    Reviewed: Allergy & Precautions, NPO status , Patient's Chart, lab work & pertinent test results  Airway Mallampati: II  TM Distance: >3 FB Neck ROM: Full    Dental  (+) Dental Advisory Given   Pulmonary neg pulmonary ROS,    breath sounds clear to auscultation       Cardiovascular negative cardio ROS   Rhythm:Regular Rate:Normal     Neuro/Psych negative neurological ROS     GI/Hepatic negative GI ROS, Neg liver ROS,   Endo/Other  Morbid obesity  Renal/GU negative Renal ROS     Musculoskeletal   Abdominal   Peds  Hematology  (+) Blood dyscrasia, anemia ,   Anesthesia Other Findings   Reproductive/Obstetrics                             Anesthesia Physical Anesthesia Plan  ASA: 3  Anesthesia Plan: General   Post-op Pain Management: Toradol IV (intra-op)*   Induction: Intravenous  PONV Risk Score and Plan: 3 and Midazolam, Dexamethasone, Ondansetron and Treatment may vary due to age or medical condition  Airway Management Planned: Oral ETT  Additional Equipment: None  Intra-op Plan:   Post-operative Plan: Extubation in OR  Informed Consent: I have reviewed the patients History and Physical, chart, labs and discussed the procedure including the risks, benefits and alternatives for the proposed anesthesia with the patient or authorized representative who has indicated his/her understanding and acceptance.     Dental advisory given  Plan Discussed with: CRNA  Anesthesia Plan Comments:         Anesthesia Quick Evaluation

## 2021-08-09 NOTE — Anesthesia Procedure Notes (Signed)
Procedure Name: Intubation Date/Time: 08/09/2021 7:43 AM Performed by: Myna Bright, CRNA Pre-anesthesia Checklist: Patient identified, Emergency Drugs available, Suction available and Patient being monitored Patient Re-evaluated:Patient Re-evaluated prior to induction Oxygen Delivery Method: Circle system utilized Preoxygenation: Pre-oxygenation with 100% oxygen Induction Type: IV induction Ventilation: Mask ventilation without difficulty Laryngoscope Size: Mac and 3 Grade View: Grade I Tube type: Oral Tube size: 7.0 mm Number of attempts: 1 Airway Equipment and Method: Stylet Placement Confirmation: ETT inserted through vocal cords under direct vision, positive ETCO2 and breath sounds checked- equal and bilateral Secured at: 21 cm Tube secured with: Tape Dental Injury: Teeth and Oropharynx as per pre-operative assessment

## 2021-08-09 NOTE — Anesthesia Postprocedure Evaluation (Signed)
Anesthesia Post Note  Patient: [name]  Procedure(s) Performed: LAPAROSCOPIC CHOLECYSTECTOMY (Abdomen)     Patient location during evaluation: PACU Anesthesia Type: General Level of consciousness: awake and alert Pain management: pain level controlled Vital Signs Assessment: post-procedure vital signs reviewed and stable Respiratory status: spontaneous breathing, nonlabored ventilation, respiratory function stable and patient connected to nasal cannula oxygen Cardiovascular status: blood pressure returned to baseline and stable Postop Assessment: no apparent nausea or vomiting Anesthetic complications: no   No notable events documented.  Last Vitals:  Vitals:   08/09/21 0942 08/09/21 1000  BP: (!) 172/106 (!) 163/99  Pulse: 72 93  Resp: 20   Temp:  36.9 C  SpO2: 99% 99%    Last Pain:  Vitals:   08/09/21 1000  TempSrc: Oral  PainSc:                  Kaylee Nixon

## 2021-08-10 ENCOUNTER — Encounter (HOSPITAL_COMMUNITY): Payer: Self-pay | Admitting: Surgery

## 2021-08-10 LAB — COMPREHENSIVE METABOLIC PANEL
ALT: 118 U/L — ABNORMAL HIGH (ref 0–44)
AST: 52 U/L — ABNORMAL HIGH (ref 15–41)
Albumin: 3.3 g/dL — ABNORMAL LOW (ref 3.5–5.0)
Alkaline Phosphatase: 132 U/L — ABNORMAL HIGH (ref 38–126)
Anion gap: 8 (ref 5–15)
BUN: 7 mg/dL (ref 6–20)
CO2: 28 mmol/L (ref 22–32)
Calcium: 8.9 mg/dL (ref 8.9–10.3)
Chloride: 104 mmol/L (ref 98–111)
Creatinine, Ser: 0.78 mg/dL (ref 0.44–1.00)
GFR, Estimated: >60 mL/min (ref >60)
Glucose, Bld: 101 mg/dL — ABNORMAL HIGH (ref 70–99)
Potassium: 3.8 mmol/L (ref 3.5–5.1)
Sodium: 140 mmol/L (ref 135–145)
Total Bilirubin: 1.3 mg/dL — ABNORMAL HIGH (ref 0.3–1.2)
Total Protein: 7.8 g/dL (ref 6.5–8.1)

## 2021-08-10 MED ORDER — ACETAMINOPHEN 325 MG PO TABS: 650.0000 mg | ORAL_TABLET | Freq: Four times a day (QID) | ORAL | Status: DC

## 2021-08-10 MED ORDER — LIP MEDEX EX OINT
1.0000 "application " | TOPICAL_OINTMENT | CUTANEOUS | Status: DC | PRN
  Filled 2021-08-10: qty 7

## 2021-08-10 MED ORDER — IBUPROFEN 600 MG PO TABS: 600.0000 mg | ORAL_TABLET | Freq: Four times a day (QID) | ORAL | 0 refills | Status: AC | PRN

## 2021-08-10 MED ORDER — ACETAMINOPHEN 325 MG PO TABS: 650.0000 mg | ORAL_TABLET | Freq: Four times a day (QID) | ORAL | Status: AC | PRN

## 2021-08-10 MED ORDER — OXYCODONE HCL 5 MG PO TABS: 5.0000 mg | ORAL_TABLET | ORAL | 0 refills | Status: AC | PRN

## 2021-08-10 NOTE — Discharge Summary (Signed)
[facility] Discharge Summary   Patient ID: Kaylee Nixon MRN: [id number] DOB/AGE: November 01, 1970 51 y.o.  Admit date: 08/07/2021 Discharge date: 08/10/2021  Admitting Diagnosis: Acute cholecystitis   Discharge Diagnosis Acute cholecystitis s/p laparoscopic cholecystectomy   Consultants None   Imaging: MR 3D Recon At Scanner  Result Date: 08/08/2021 CLINICAL DATA:  Diffuse abdominal pain and nausea and vomiting for 2 weeks. Cholelithiasis. EXAM: MRI ABDOMEN WITHOUT AND WITH CONTRAST (INCLUDING MRCP) TECHNIQUE: Multiplanar multisequence MR imaging of the abdomen was performed both before and after the administration of intravenous contrast. Heavily T2-weighted images of the biliary and pancreatic ducts were obtained, and three-dimensional MRCP images were rendered by post processing. CONTRAST:  66m GADAVIST GADOBUTROL 1 MMOL/ML IV SOLN COMPARISON:  None Available. FINDINGS: Lower chest: Mild bilateral lower lobe atelectasis versus infiltrates. Hepatobiliary: A 2.3 cm benign hemangioma is seen in segment 2 of the left hepatic lobe. No other hepatic masses are identified. Several small gallstones are seen. The gallbladder is distended and shows mild diffuse gallbladder wall thickening and pericholecystic edema, consistent with acute cholecystitis. No evidence of biliary ductal dilatation or choledocholithiasis. Pancreas:  No mass or inflammatory changes. Spleen:  Within normal limits in size and appearance. Adrenals/Urinary Tract: No masses identified. No evidence of hydronephrosis. Stomach/Bowel: Unremarkable. Vascular/Lymphatic: No pathologically enlarged lymph nodes identified. No acute vascular findings. Other:  None. Musculoskeletal:  No suspicious bone lesions identified. IMPRESSION: Findings consistent with acute cholecystitis. No evidence of biliary ductal dilatation or choledocholithiasis. 2.3 cm benign hemangioma in the left hepatic lobe. Mild bilateral lower lobe atelectasis versus  infiltrates. Electronically Signed   By: JMarlaine HindM.D.   On: 08/08/2021 13:59   DG C-Arm 1-60 Min-No Report  Result Date: 08/09/2021 Fluoroscopy was utilized by the requesting physician.  No radiographic interpretation.   MR ABDOMEN MRCP W WO CONTAST  Result Date: 08/08/2021 CLINICAL DATA:  Diffuse abdominal pain and nausea and vomiting for 2 weeks. Cholelithiasis. EXAM: MRI ABDOMEN WITHOUT AND WITH CONTRAST (INCLUDING MRCP) TECHNIQUE: Multiplanar multisequence MR imaging of the abdomen was performed both before and after the administration of intravenous contrast. Heavily T2-weighted images of the biliary and pancreatic ducts were obtained, and three-dimensional MRCP images were rendered by post processing. CONTRAST:  11mGADAVIST GADOBUTROL 1 MMOL/ML IV SOLN COMPARISON:  None Available. FINDINGS: Lower chest: Mild bilateral lower lobe atelectasis versus infiltrates. Hepatobiliary: A 2.3 cm benign hemangioma is seen in segment 2 of the left hepatic lobe. No other hepatic masses are identified. Several small gallstones are seen. The gallbladder is distended and shows mild diffuse gallbladder wall thickening and pericholecystic edema, consistent with acute cholecystitis. No evidence of biliary ductal dilatation or choledocholithiasis. Pancreas:  No mass or inflammatory changes. Spleen:  Within normal limits in size and appearance. Adrenals/Urinary Tract: No masses identified. No evidence of hydronephrosis. Stomach/Bowel: Unremarkable. Vascular/Lymphatic: No pathologically enlarged lymph nodes identified. No acute vascular findings. Other:  None. Musculoskeletal:  No suspicious bone lesions identified. IMPRESSION: Findings consistent with acute cholecystitis. No evidence of biliary ductal dilatation or choledocholithiasis. 2.3 cm benign hemangioma in the left hepatic lobe. Mild bilateral lower lobe atelectasis versus infiltrates. Electronically Signed   By: JoMarlaine Hind.D.   On: 08/08/2021 13:59     Procedures Dr. DoCoralie Keens6/4/23) - Laparoscopic Cholecystectomy   Hospital Course:  Patient is a [age]ear old female who presented to the ED with abdominal pain.  Workup showed cholelithiasis and concern for acute cholecystitis.  Patient was admitted and underwent procedure listed above.  Tolerated procedure well and was transferred to the floor.  Diet was advanced as tolerated.  On POD1, the patient was voiding well, tolerating diet, ambulating well, pain well controlled, vital signs stable, incisions c/d/i and felt stable for discharge home.  Patient will follow up in our office in 3 weeks and knows to call with questions or concerns. She will call to confirm appointment date/time.    Physical Exam: General:  Alert, NAD, pleasant, comfortable Abd:  Soft, ND, mild tenderness, incisions C/D/I  I or a member of my team have reviewed this patient in the Controlled Substance Database.   Allergies as of 08/10/2021   No Known Allergies      Medication List     STOP taking these medications    methocarbamol 500 MG tablet Commonly known as: ROBAXIN   naproxen sodium 220 MG tablet Commonly known as: ALEVE       TAKE these medications    acetaminophen 325 MG tablet Commonly known as: TYLENOL Take 2 tablets (650 mg total) by mouth every 6 (six) hours as needed for mild pain or fever.   cetirizine 10 MG tablet Commonly known as: ZYRTEC Take 10 mg by mouth daily.   fluticasone 50 MCG/ACT nasal spray Commonly known as: FLONASE Place 1 spray into both nostrils daily.   ibuprofen 600 MG tablet Commonly known as: ADVIL Take 1 tablet (600 mg total) by mouth every 6 (six) hours as needed for moderate pain. Take with food   oxyCODONE 5 MG immediate release tablet Commonly known as: Oxy IR/ROXICODONE Take 1 tablet (5 mg total) by mouth every 4 (four) hours as needed for moderate pain or severe pain.          Follow-up Information     Sur[facility], [place].  Schedule an appointment as soon as possible for a visit in 3 week(s).   Specialty: General Surgery Why: Our office is working on scheduling a follow up in about 3 weeks. Please call to confirm appointment date/time. Please arrive 30 min prior to appointment time. Bring photo ID and insurance information with you. Contact information: [address] [place] [address] [phone]                 Signed: Norm Parcel , [facility] 08/10/2021, 10:04 AM Please see Amion for pager number during day hours 7:00am-4:30pm

## 2021-08-10 NOTE — Discharge Instructions (Signed)
[facility] LAPAROSCOPIC SURGERY: POST OP INSTRUCTIONS Always review your discharge instruction sheet given to you by the facility where your surgery was performed. IF YOU HAVE DISABILITY OR FAMILY LEAVE FORMS, YOU MUST BRING THEM TO THE OFFICE FOR PROCESSING.   DO NOT GIVE THEM TO YOUR DOCTOR.  PAIN CONTROL  First take acetaminophen (Tylenol) AND/or ibuprofen (Advil) to control your pain after surgery.  Follow directions on package.  Taking acetaminophen (Tylenol) and/or ibuprofen (Advil) regularly after surgery will help to control your pain and lower the amount of prescription pain medication you may need.  You should not take more than 3,000 mg (3 grams) of acetaminophen (Tylenol) in 24 hours.  You should not take ibuprofen (Advil), aleve, motrin, naprosyn or other NSAIDS if you have a history of stomach ulcers or chronic kidney disease.  A prescription for pain medication may be given to you upon discharge.  Take your pain medication as prescribed, if you still have uncontrolled pain after taking acetaminophen (Tylenol) or ibuprofen (Advil). Use ice packs to help control pain. If you need a refill on your pain medication, please contact your pharmacy.  They will contact our office to request authorization. Prescriptions will not be filled after 5pm or on week-ends.  HOME MEDICATIONS Take your usually prescribed medications unless otherwise directed.  DIET You should follow a light diet the first few days after arrival home.  Be sure to include lots of fluids daily. Avoid fatty, fried foods.   CONSTIPATION It is common to experience some constipation after surgery and if you are taking pain medication.  Increasing fluid intake and taking a stool softener (such as Colace) will usually help or prevent this problem from occurring.  A mild laxative (Milk of Magnesia or Miralax) should be taken according to package instructions if there are no bowel movements after 48  hours.  WOUND/INCISION CARE Most patients will experience some swelling and bruising in the area of the incisions.  Ice packs will help.  Swelling and bruising can take several days to resolve.  Unless discharge instructions indicate otherwise, follow guidelines below  STERI-STRIPS - you may remove your outer bandages 48 hours after surgery, and you may shower at that time.  You have steri-strips (small skin tapes) in place directly over the incision.  These strips should be left on the skin for 7-10 days.   DERMABOND/SKIN GLUE - you may shower in 24 hours.  The glue will flake off over the next 2-3 weeks. Any sutures or staples will be removed at the office during your follow-up visit.  ACTIVITIES You may resume regular (light) daily activities beginning the next day--such as daily self-care, walking, climbing stairs--gradually increasing activities as tolerated.  You may have sexual intercourse when it is comfortable.  Refrain from any heavy lifting or straining until approved by your doctor. You may drive when you are no longer taking prescription pain medication, you can comfortably wear a seatbelt, and you can safely maneuver your car and apply brakes.  FOLLOW-UP You should see your doctor in the office for a follow-up appointment approximately 2-3 weeks after your surgery.  You should have been given your post-op/follow-up appointment when your surgery was scheduled.  If you did not receive a post-op/follow-up appointment, make sure that you call for this appointment within a day or two after you arrive home to insure a convenient appointment time.   WHEN TO CALL YOUR DOCTOR: Fever over 101.0 Inability to urinate Continued bleeding from incision.   Increased pain, redness, or drainage from the incision. Increasing abdominal pain  The clinic staff is available to answer your questions during regular business hours.  Please don't hesitate to call and ask to speak to one of the nurses for  clinical concerns.  If you have a medical emergency, go to the nearest emergency room or call 911.  A surgeon from [facility] is always on call at the hospital. [address], [address], [place], [place]  [address] ? [address] [address], [place], [place]   [address] [phone] ? [phone] ? FAX [phone] Web site: [email or web address]  

## 2021-08-10 NOTE — Plan of Care (Signed)
Pt ready to DC home 

## 2021-08-10 NOTE — Progress Notes (Signed)
Transition of Care [facility]) Screening Note  Patient Details  Name: Kaylee Nixon Date of Birth: 05-01-1970  Transition [facility]) CM/SW Contact:    Sherie Don, LCSW Phone Number: 08/10/2021, 9:33 AM  Transition of Care Department [facility]) has reviewed patient and no TOC needs have been identified at this time. We will continue to monitor patient advancement through interdisciplinary progression rounds. If new patient transition needs arise, please place a TOC consult.

## 2021-08-11 LAB — SURGICAL PATHOLOGY

## 2023-09-29 ENCOUNTER — Other Ambulatory Visit: Payer: Self-pay | Admitting: Family Medicine

## 2023-09-29 DIAGNOSIS — R519 Headache, unspecified: Secondary | ICD-10-CM

## 2023-09-30 ENCOUNTER — Encounter: Payer: Self-pay | Admitting: Family Medicine

## 2023-10-01 ENCOUNTER — Ambulatory Visit
Admission: RE | Admit: 2023-10-01 | Discharge: 2023-10-01 | Disposition: A | Discharge status: Home / Self Care | Source: Ambulatory Visit | Attending: Family Medicine | Admitting: Family Medicine

## 2023-10-01 DIAGNOSIS — R519 Headache, unspecified: Secondary | ICD-10-CM

## 2024-03-21 ENCOUNTER — Other Ambulatory Visit (HOSPITAL_BASED_OUTPATIENT_CLINIC_OR_DEPARTMENT_OTHER): Payer: Self-pay | Admitting: Family Medicine

## 2024-03-21 DIAGNOSIS — E785 Hyperlipidemia, unspecified: Secondary | ICD-10-CM

## 2024-03-30 ENCOUNTER — Ambulatory Visit (HOSPITAL_COMMUNITY)
Admission: RE | Admit: 2024-03-30 | Discharge: 2024-03-30 | Disposition: A | Payer: Self-pay | Source: Ambulatory Visit | Attending: Family Medicine | Admitting: Family Medicine

## 2024-03-30 DIAGNOSIS — E785 Hyperlipidemia, unspecified: Secondary | ICD-10-CM | POA: Insufficient documentation
